# Patient Record
Sex: Female | Born: 2000 | Race: Black or African American | Hispanic: No | Marital: Single | Smoking: Never smoker
Health system: Southern US, Community
[De-identification: ages and names within clinical notes are randomized; demographics above are authoritative.]

## PROBLEM LIST (undated history)

## (undated) DIAGNOSIS — E785 Hyperlipidemia, unspecified: Secondary | ICD-10-CM

## (undated) DIAGNOSIS — F429 Obsessive-compulsive disorder, unspecified: Secondary | ICD-10-CM

## (undated) DIAGNOSIS — F32A Depression, unspecified: Secondary | ICD-10-CM

## (undated) DIAGNOSIS — F5082 Avoidant/restrictive food intake disorder: Secondary | ICD-10-CM

## (undated) HISTORY — DX: Hyperlipidemia, unspecified: E78.5

## (undated) HISTORY — PX: WISDOM TOOTH EXTRACTION: SHX21

## (undated) HISTORY — PX: AXILLARY LYMPH NODE BIOPSY: SHX5737

## (undated) HISTORY — PX: CHOLECYSTECTOMY: SHX55

---
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
## 2018-08-04 ENCOUNTER — Other Ambulatory Visit: Payer: Self-pay | Admitting: General Surgery

## 2018-10-28 ENCOUNTER — Emergency Department (HOSPITAL_COMMUNITY): Payer: Medicaid Other

## 2018-10-28 ENCOUNTER — Other Ambulatory Visit: Payer: Self-pay

## 2018-10-28 ENCOUNTER — Emergency Department (HOSPITAL_COMMUNITY)
Admission: EM | Admit: 2018-10-28 | Discharge: 2018-10-28 | Disposition: A | Discharge status: Home / Self Care | Payer: Medicaid Other | Attending: Emergency Medicine | Admitting: Emergency Medicine

## 2018-10-28 DIAGNOSIS — F419 Anxiety disorder, unspecified: Secondary | ICD-10-CM | POA: Insufficient documentation

## 2018-10-28 DIAGNOSIS — R079 Chest pain, unspecified: Secondary | ICD-10-CM

## 2018-10-28 DIAGNOSIS — Z79899 Other long term (current) drug therapy: Secondary | ICD-10-CM | POA: Diagnosis not present

## 2018-10-28 LAB — I-STAT BETA HCG BLOOD, ED (MC, WL, AP ONLY): I-stat hCG, quantitative: <5 m[IU]/mL (ref <5)

## 2018-10-28 LAB — BASIC METABOLIC PANEL
Anion gap: 9 (ref 5–15)
BUN: 5 mg/dL — ABNORMAL LOW (ref 6–20)
CO2: 23 mmol/L (ref 22–32)
Calcium: 9.2 mg/dL (ref 8.9–10.3)
Chloride: 108 mmol/L (ref 98–111)
Creatinine, Ser: 0.73 mg/dL (ref 0.44–1.00)
GFR calc Af Amer: >60 mL/min (ref >60)
GFR calc non Af Amer: >60 mL/min (ref >60)
Glucose, Bld: 114 mg/dL — ABNORMAL HIGH (ref 70–99)
Potassium: 3.4 mmol/L — ABNORMAL LOW (ref 3.5–5.1)
Sodium: 140 mmol/L (ref 135–145)

## 2018-10-28 LAB — CBC
HCT: 38 % (ref 36.0–46.0)
Hemoglobin: 11.8 g/dL — ABNORMAL LOW (ref 12.0–15.0)
MCH: 25.1 pg — ABNORMAL LOW (ref 26.0–34.0)
MCHC: 31.1 g/dL (ref 30.0–36.0)
MCV: 80.9 fL (ref 80.0–100.0)
Platelets: 570 10*3/uL — ABNORMAL HIGH (ref 150–400)
RBC: 4.7 MIL/uL (ref 3.87–5.11)
RDW: 15.2 % (ref 11.5–15.5)
WBC: 8.6 10*3/uL (ref 4.0–10.5)
nRBC: 0 % (ref 0.0–0.2)

## 2018-10-28 LAB — TROPONIN I (HIGH SENSITIVITY): Troponin I (High Sensitivity): <2 ng/L (ref <18)

## 2018-10-28 IMAGING — DX CHEST - 2 VIEW
2 series · 2 of 2 positions shown · non-contrast
Comparison: None.

CLINICAL DATA: Recurring chest pain for 3 weeks.

EXAM:
CHEST - 2 VIEW

[w chest pa]
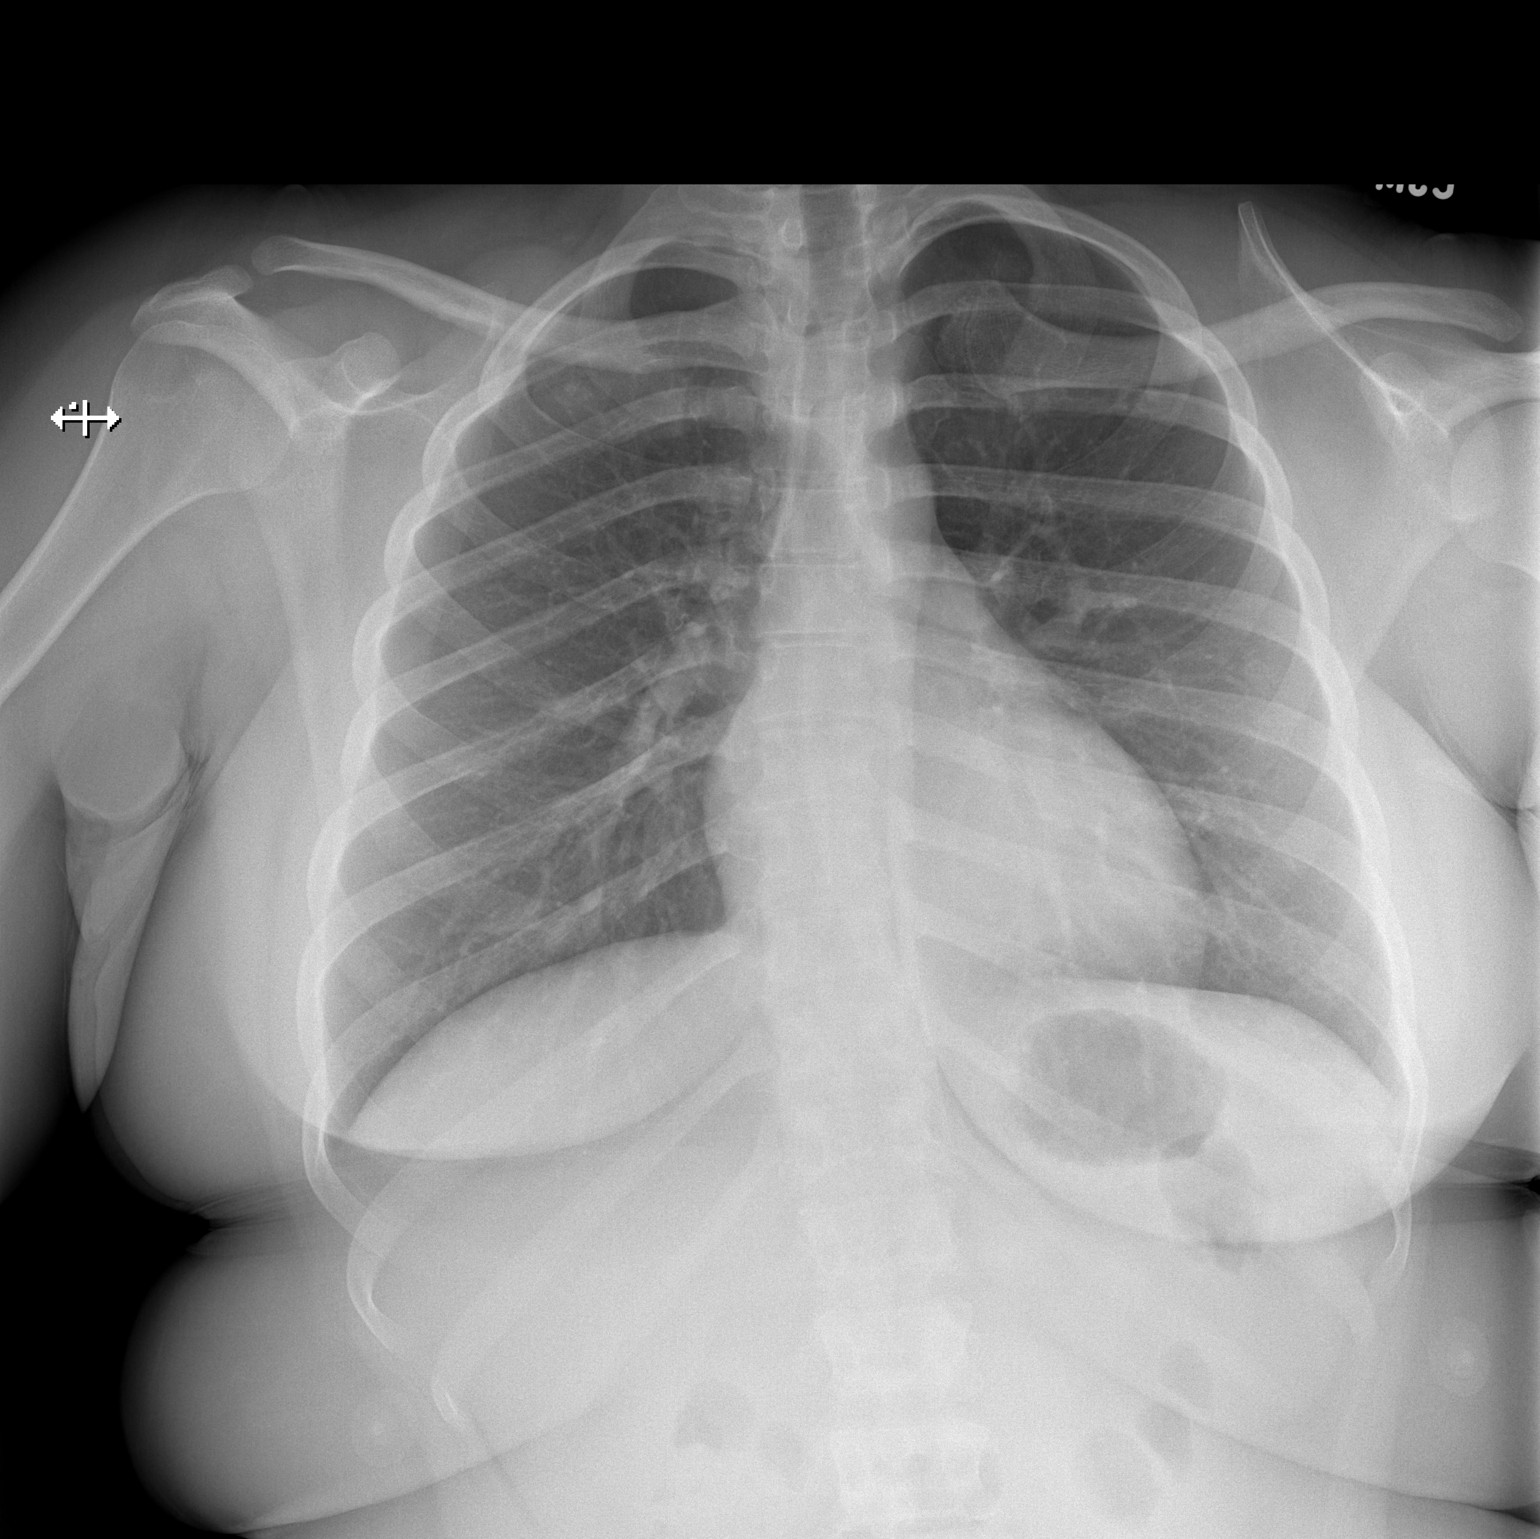

[w chest lat]
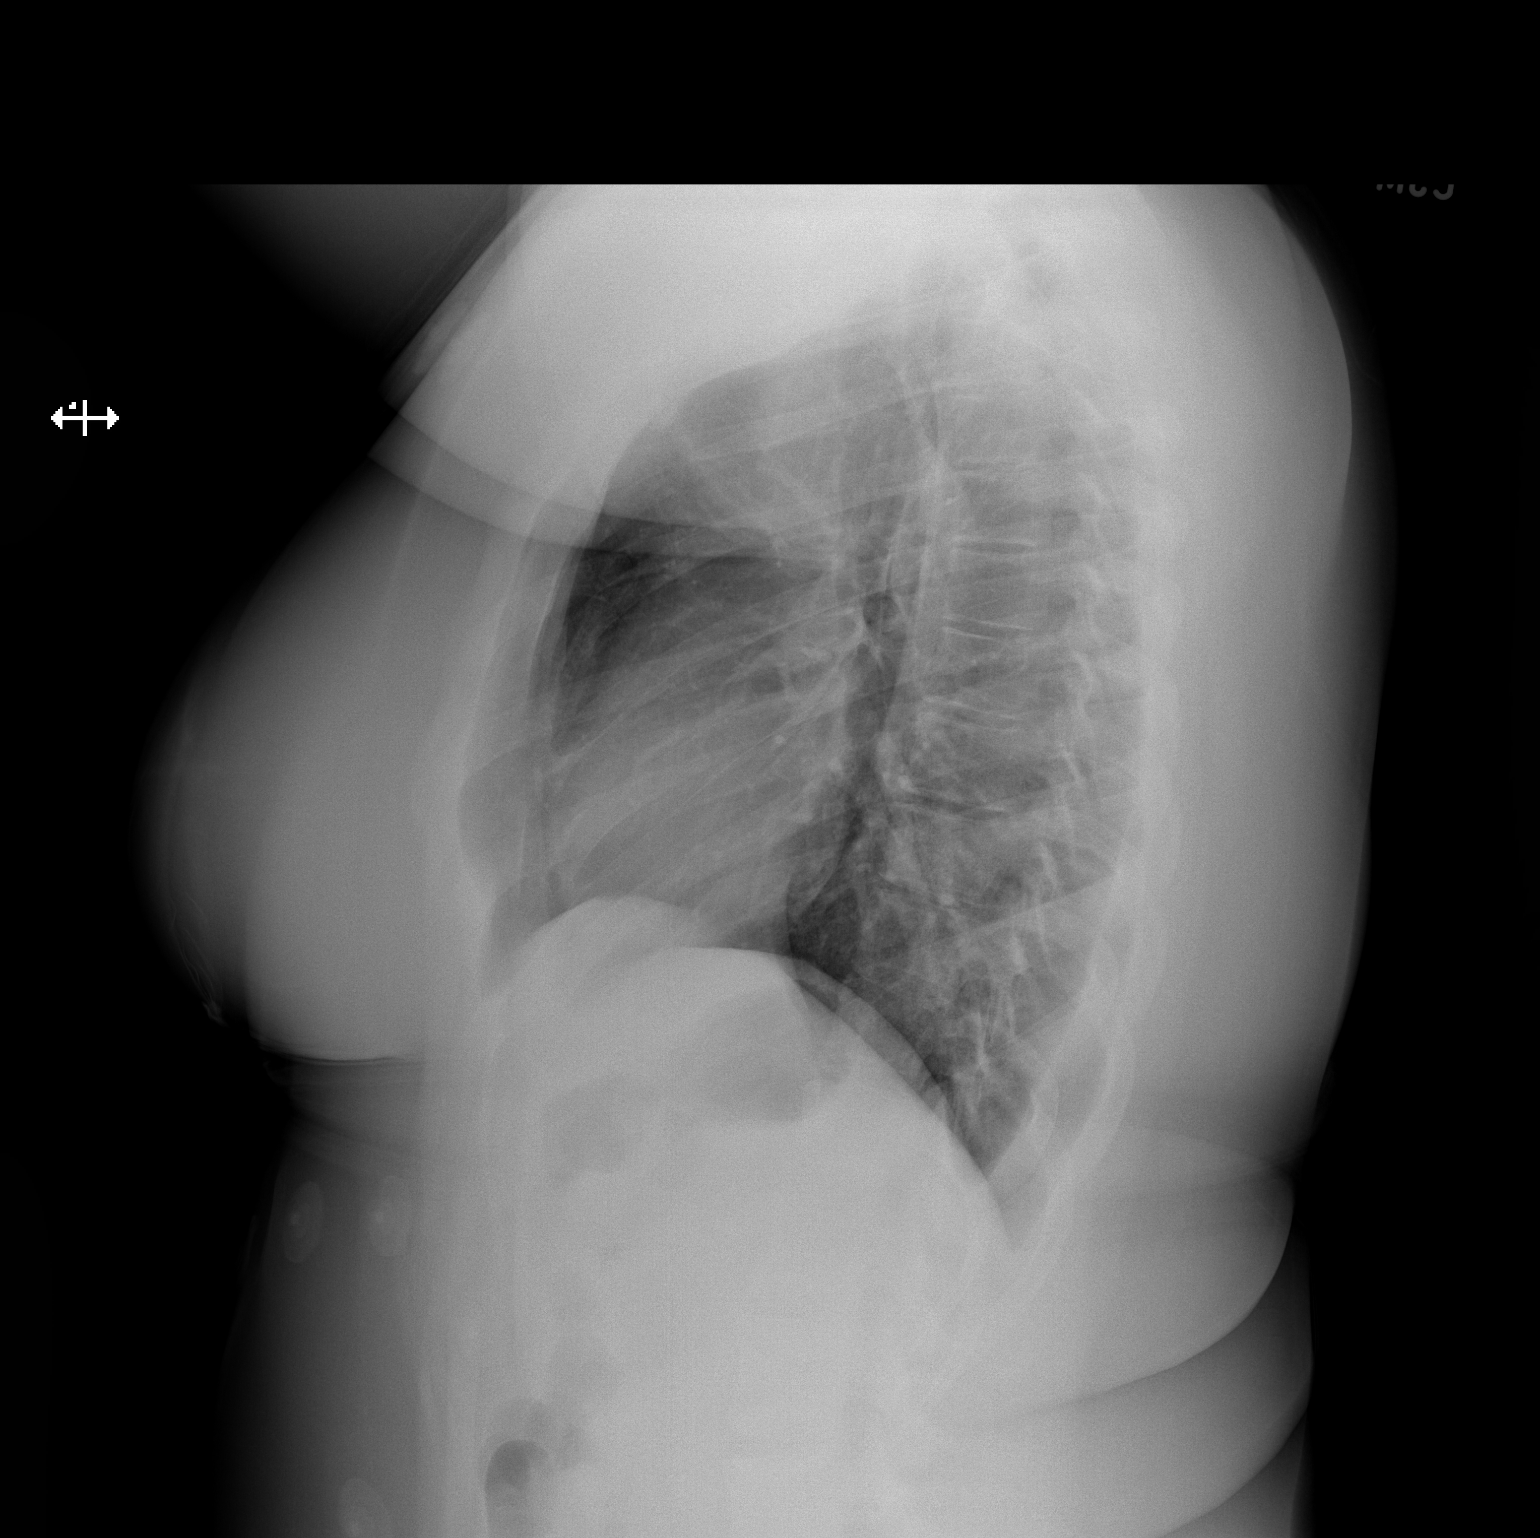

[2 of 2 positions shown; findings below may reference images not displayed]

FINDINGS: The heart size and mediastinal contours are within normal limits.
Both lungs are clear. The visualized skeletal structures are
unremarkable.
IMPRESSION: No active cardiopulmonary disease.

## 2018-10-28 MED ORDER — SODIUM CHLORIDE 0.9% FLUSH: 3.0000 mL | Freq: Once | INTRAVENOUS | Status: DC

## 2018-10-28 NOTE — ED Triage Notes (Signed)
Patient reports intermittent chest pain, mostly beginning in mornings, over the last 2-3 weeks, but has become more frequent throughout the day the last few days. Has chest x-ray done at doctor that was normal last week. History of anxiety and feels her heart racing from time to time, but chest pains have begun to concern her. Resp e/u, skin w/d. Denies shortness of breath, dizziness.

## 2018-10-28 NOTE — Discharge Instructions (Addendum)
Please read attached information. If you experience any new or worsening signs or symptoms please return to the emergency room for evaluation. Please follow-up with your primary care provider or specialist as discussed.  °

## 2018-10-28 NOTE — ED Provider Notes (Signed)
[facility] EMERGENCY DEPARTMENT Provider Note   CSN: [id number] Arrival date & time: 10/28/18  1109    History   Chief Complaint Chief Complaint  Patient presents with  . Anxiety  . Chest Pain    HPI Kristen Pacheco is a 18 y.o. female.     HPI   18 year old female presents today with complaints of anxiety and chest pain.  Patient notes symptoms started proximally 1 month ago with racing heart.  She notes that she wakes up in the morning her heart is racing she feels heaviness on her chest.  She notes that throughout the day the symptoms do improve but come back again the next day.  She notes most recently over the last 3 days she has had increasing discomfort in her chest.  She notes that waking in the morning this morning around 4:30 AM she did have the discomfort and has persisted since that time.  She denies any cough or fever, she notes that her heart is racing and feels like she is having anxiety.  She saw her primary care where she had a chest x-ray that looked normal per her.  She denies any significant past cardiac history, no family history of cardiac disease.  She does not smoke or use drugs.  She denies any significant past medical history.  No history DVT or PE or any significant risk factors.  No lower extremity swelling or edema.  No past medical history on file.  There are no active problems to display for this patient.   Past Surgical History:  Procedure Laterality Date  . AXILLARY LYMPH NODE BIOPSY    . WISDOM TOOTH EXTRACTION       OB History   No obstetric history on file.      Home Medications    Prior to Admission medications   Medication Sig Start Date End Date Taking? Authorizing Provider  ibuprofen (ADVIL,MOTRIN) 100 MG/5ML suspension Take 30 mLs (600 mg total) by mouth every 8 (eight) hours as needed for mild pain or moderate pain. 10/19/15   Clayton Bibles, PA-C    Family History No family history on file.  Social History  Social History   Tobacco Use  . Smoking status: Never Smoker  . Smokeless tobacco: Never Used  Substance Use Topics  . Alcohol use: No  . Drug use: No     Allergies   Patient has no known allergies.   Review of Systems Review of Systems  All other systems reviewed and are negative.    Physical Exam Updated Vital Signs BP (!) 131/97 (BP Location: Right Arm)   Pulse (!) 118   Temp 97.8 F (36.6 C) (Oral)   Resp 18   LMP 10/24/2018   SpO2 98%   Physical Exam Vitals signs and nursing note reviewed.  Constitutional:      Appearance: She is well-developed.  HENT:     Head: Normocephalic and atraumatic.  Eyes:     General: No scleral icterus.       Right eye: No discharge.        Left eye: No discharge.     Conjunctiva/sclera: Conjunctivae normal.     Pupils: Pupils are equal, round, and reactive to light.  Neck:     Musculoskeletal: Normal range of motion.     Vascular: No JVD.     Trachea: No tracheal deviation.  Cardiovascular:     Rate and Rhythm: Normal rate and regular rhythm.  Pulmonary:  Effort: Pulmonary effort is normal. No respiratory distress.     Breath sounds: Normal breath sounds. No stridor. No wheezing, rhonchi or rales.  Musculoskeletal:     Comments: No lower extremity swelling or edema  Neurological:     Mental Status: She is alert and oriented to person, place, and time.     Coordination: Coordination normal.  Psychiatric:        Behavior: Behavior normal.        Thought Content: Thought content normal.        Judgment: Judgment normal.     ED Treatments / Results  Labs (all labs ordered are listed, but only abnormal results are displayed) Labs Reviewed  BASIC METABOLIC PANEL - Abnormal; Notable for the following components:      Result Value   Potassium 3.4 (*)    Glucose, Bld 114 (*)    BUN 5 (*)    All other components within normal limits  CBC - Abnormal; Notable for the following components:   Hemoglobin 11.8 (*)     MCH 25.1 (*)    Platelets 570 (*)    All other components within normal limits  I-STAT BETA HCG BLOOD, ED ([facility], [facility], [facility] ONLY)  TROPONIN I (HIGH SENSITIVITY)  TROPONIN I (HIGH SENSITIVITY)    EKG None  Radiology Dg Chest 2 View  Result Date: 10/28/2018 CLINICAL DATA:  Recurring chest pain for 3 weeks. EXAM: CHEST - 2 VIEW COMPARISON:  None. FINDINGS: The heart size and mediastinal contours are within normal limits. Both lungs are clear. The visualized skeletal structures are unremarkable. IMPRESSION: No active cardiopulmonary disease. Electronically Signed   By: Sherian ReinWei-Chen  Lin M.D.   On: 10/28/2018 12:28    Procedures Procedures (including critical care time)  Medications Ordered in ED Medications - No data to display   Initial Impression / Assessment and Plan / ED Course  I have reviewed the triage vital signs and the nursing notes.  Pertinent labs & imaging results that were available during my care of the patient were reviewed by me and considered in my medical decision making (see chart for details).         Assessment/Plan: 18 year old female presents today with chest pain.  I have high suspicion for anxiety, low suspicion for ACS, PE, SVT, dissection or any other life-threatening etiology.  EKG shows sinus tachycardia, in the room patient's heart rate down to 70.  Patient has reassuring work-up here, she has heart score of 0, Wells of 0, PERC negative.  No signs of infectious etiology.  Patient will follow-up as an outpatient with cardiology, she will return immediately if develops any new or worsening signs or symptoms.  Verbalized understanding and agreement to today's plan had no further questions or concerns at time of discharge.    Final Clinical Impressions(s) / ED Diagnoses   Final diagnoses:  Chest pain, unspecified type  Anxiety    ED Discharge Orders    None       Rosalio LoudHedges, Adib Wahba, PA-C 10/28/18 Modena [id number]    Goldston, Scott, MD 10/29/18 [phone]

## 2018-10-30 ENCOUNTER — Telehealth: Payer: Self-pay

## 2018-10-30 NOTE — Telephone Encounter (Signed)
REFERRAL FROM ED SENT TO SCHEDULING 

## 2019-09-07 ENCOUNTER — Encounter (HOSPITAL_COMMUNITY): Payer: Self-pay

## 2019-09-07 ENCOUNTER — Emergency Department (HOSPITAL_COMMUNITY)
Admission: EM | Admit: 2019-09-07 | Discharge: 2019-09-08 | Disposition: A | Discharge status: Home / Self Care | Payer: Medicaid Other | Attending: Emergency Medicine | Admitting: Emergency Medicine

## 2019-09-07 DIAGNOSIS — F5082 Avoidant/restrictive food intake disorder: Secondary | ICD-10-CM | POA: Diagnosis not present

## 2019-09-07 DIAGNOSIS — Z79899 Other long term (current) drug therapy: Secondary | ICD-10-CM | POA: Diagnosis not present

## 2019-09-07 DIAGNOSIS — R42 Dizziness and giddiness: Secondary | ICD-10-CM | POA: Insufficient documentation

## 2019-09-07 DIAGNOSIS — R110 Nausea: Secondary | ICD-10-CM | POA: Diagnosis present

## 2019-09-07 LAB — CBC
HCT: 42.9 % (ref 36.0–46.0)
Hemoglobin: 13.7 g/dL (ref 12.0–15.0)
MCH: 25.4 pg — ABNORMAL LOW (ref 26.0–34.0)
MCHC: 31.9 g/dL (ref 30.0–36.0)
MCV: 79.4 fL — ABNORMAL LOW (ref 80.0–100.0)
Platelets: 539 10*3/uL — ABNORMAL HIGH (ref 150–400)
RBC: 5.4 MIL/uL — ABNORMAL HIGH (ref 3.87–5.11)
RDW: 15.1 % (ref 11.5–15.5)
WBC: 8.8 10*3/uL (ref 4.0–10.5)
nRBC: 0 % (ref 0.0–0.2)

## 2019-09-07 LAB — COMPREHENSIVE METABOLIC PANEL
ALT: 64 U/L — ABNORMAL HIGH (ref 0–44)
AST: 54 U/L — ABNORMAL HIGH (ref 15–41)
Albumin: 3.9 g/dL (ref 3.5–5.0)
Alkaline Phosphatase: 89 U/L (ref 38–126)
Anion gap: 15 (ref 5–15)
BUN: <5 mg/dL — ABNORMAL LOW (ref 6–20)
CO2: 20 mmol/L — ABNORMAL LOW (ref 22–32)
Calcium: 9.4 mg/dL (ref 8.9–10.3)
Chloride: 102 mmol/L (ref 98–111)
Creatinine, Ser: 0.96 mg/dL (ref 0.44–1.00)
GFR calc Af Amer: >60 mL/min (ref >60)
GFR calc non Af Amer: >60 mL/min (ref >60)
Glucose, Bld: 108 mg/dL — ABNORMAL HIGH (ref 70–99)
Potassium: 3.2 mmol/L — ABNORMAL LOW (ref 3.5–5.1)
Sodium: 137 mmol/L (ref 135–145)
Total Bilirubin: 1.3 mg/dL — ABNORMAL HIGH (ref 0.3–1.2)
Total Protein: 8 g/dL (ref 6.5–8.1)

## 2019-09-07 LAB — LIPASE, BLOOD: Lipase: 35 U/L (ref 11–51)

## 2019-09-07 LAB — I-STAT BETA HCG BLOOD, ED (MC, WL, AP ONLY): I-stat hCG, quantitative: <5 m[IU]/mL (ref <5)

## 2019-09-07 LAB — CBG MONITORING, ED: Glucose-Capillary: 99 mg/dL (ref 70–99)

## 2019-09-07 MED ORDER — SODIUM CHLORIDE 0.9% FLUSH: 3.0000 mL | Freq: Once | INTRAVENOUS | Status: DC

## 2019-09-07 NOTE — ED Triage Notes (Signed)
Pt reports that she has not ate in two weeks since having her gallbladder removed and went to her PCP and was sent here to be admitted to an eating disorder clinic. Pt reports she is not eating because she is scared she will vomit, prescribed antiemetics and has not taken them. Pt denies SI/HI/AVH

## 2019-09-08 LAB — MAGNESIUM: Magnesium: 1.8 mg/dL (ref 1.7–2.4)

## 2019-09-08 LAB — URINALYSIS, ROUTINE W REFLEX MICROSCOPIC
Bilirubin Urine: NEGATIVE
Glucose, UA: NEGATIVE mg/dL
Ketones, ur: 80 mg/dL — AB
Nitrite: NEGATIVE
Protein, ur: 100 mg/dL — AB
RBC / HPF: >50 RBC/hpf — ABNORMAL HIGH (ref 0–5)
Specific Gravity, Urine: 1.018 (ref 1.005–1.030)
pH: 5 (ref 5.0–8.0)

## 2019-09-08 LAB — TSH: TSH: 1.537 u[IU]/mL (ref 0.350–4.500)

## 2019-09-08 LAB — PHOSPHORUS: Phosphorus: 3.8 mg/dL (ref 2.5–4.6)

## 2019-09-08 LAB — CK: Total CK: 66 U/L (ref 38–234)

## 2019-09-08 MED ORDER — SODIUM CHLORIDE 0.9 % IV BOLUS
1000.0000 mL | Freq: Once | INTRAVENOUS | Status: AC
  Administered 2019-09-08: 1000 mL via INTRAVENOUS

## 2019-09-08 MED ORDER — POTASSIUM CHLORIDE CRYS ER 20 MEQ PO TBCR
30.0000 meq | EXTENDED_RELEASE_TABLET | Freq: Once | ORAL | Status: AC
  Administered 2019-09-08: 30 meq via ORAL
  Filled 2019-09-08: qty 1

## 2019-09-08 MED ORDER — POTASSIUM CHLORIDE ER 10 MEQ PO TBCR: 20.0000 meq | EXTENDED_RELEASE_TABLET | Freq: Every day | ORAL | 0 refills | Status: DC

## 2019-09-08 NOTE — ED Notes (Signed)
Pt made aware we need a urine sample. Pt voice she can not go at this time

## 2019-09-08 NOTE — Discharge Instructions (Addendum)
You have been medically cleared, your labs are reassuring.  I want you to take the over-the-counter potassium as we spoke about.  I want you to follow-up with your primary care and discuss your options for outpatient services for eating disorders.  I want you to use the guides attached.  Come back to the emergency department if you have any new or worsening concerning symptoms.  Have your potassium rechecked by your primary care in the next couple of days.

## 2019-09-08 NOTE — BH Assessment (Addendum)
Comprehensive Clinical Assessment (CCA) Screening, Triage and Referral Note  09/08/2019 Kristen Pacheco [id number]  Visit Diagnosis: No diagnosis found.   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  Patient reports she had her gal bladder removed in May of 2020.  She was advised to limit fatty foods.  She has had difficulty with disordered eating and restricting on an off since.  She is working with a Health and safety inspector and provider at [employer] clinic.  Patient is currently enrolled and has been taking remote classes.  Her clinic provider advised that she present to [facility] for further evaluation and medical care, as she has been limiting and restricting intake.  She reports she last ate a piece of toast several days ago and has since only been drinking minimal amounts of water and gatorade occasionally.  Her provider and nutritionist have recommended inpatient treatment at an eating disorder program.  She was referred to [facility], however she learned today that they do not take her insurance.  She was referred to [facility] for Eating Disorders today.  She was informed that they may not have a bed for 2-3 weeks and her providers wanted her to be seen for a medical exam sooner.  Patient is open to inpatient eating disorder treatment if this is strongly advised and necessary, however prefers a lower level of care.  She worries about taking a medical leave next semester and getting behind with graduating with her class.  Patient denies SI, HI and AVH.  She has no psychiatric history outside of situational depression related to transitioning to remote learning last year.  She was prescribed wellbutrin and continues to take it currently.    Disposition:  Per Kristen Sequin, NP patient is psychiatrically cleared.  It is  recommended that once medically cleared, that patient follow up with current providers who are seeking treatment for patient.  She was also provided with psychologytoday website to search for area outpatient providers who specialize in eating disorders.    Patient Reported Information How did you hear about [place]? Self   Referral name: No data recorded  Referral phone number: No data recorded Whom do you see for routine medical problems? Primary Care   Practice/Facility Name: [facility]  - Kristen Pacheco   Practice/Facility Phone Number: No data recorded  Name of Contact: See above   Contact Number: No data recorded  Contact Fax Number: No data recorded  Prescriber Name: See above   Prescriber Address (if known): [facility]  What Is the Reason for Your Visit/Call Today? Patient states since gall bladder removed, became overly focused on food restriction of fatty foods.  This has become problematic.  How Long Has This Been Causing You Problems? 1-6 months  Have You Recently Been in Any Inpatient Treatment (Hospital/Detox/Crisis Center/28-Day Program)? No   Name/Location of Program/Hospital:No data recorded  How Long Were You There? No data recorded  When Were You Discharged? No data recorded Have You Ever Received Services From [facility] Before? Yes   Who Do You See at [facility]? Currently at [facility]  Have You Recently Had Any Thoughts About Hurting Yourself? No   Are You Planning to Commit Suicide/Harm Yourself At This time?  No  Have you Recently Had Thoughts About Hurting Someone Kristen Pacheco? No   Explanation: No data  recorded Have You Used Any Alcohol or Drugs in the Past 24 Hours? No   How Long Ago Did You Use Drugs or Alcohol?  No data recorded  What Did You Use and How Much? No data recorded What Do You Feel Would Help You the Most Today? Assessment Only  Do You Currently Have a Therapist/Psychiatrist? Yes   Name of Therapist/Psychiatrist: Marlowe Pacheco with  [place] Recently Discharged From Any Office Practice or Programs? No   Explanation of Discharge From Practice/Program:  No data recorded    CCA Screening Triage Referral Assessment Type of Contact: Tele-Assessment   Is this Initial or Reassessment? Initial Assessment   Date Telepsych consult ordered in [facility]:  09/08/19   Time Telepsych consult ordered in [facility]:  1145  Patient Reported Information Reviewed? Yes   Patient Left Without Being Seen? No data recorded  Reason for Not Completing Assessment: No data recorded Collateral Involvement: Patient's mother is present for assessment, at patient's request.  Does Patient Have a Court Appointed Legal Guardian? No data recorded  Name and Contact of Legal Guardian:  No data recorded If Minor and Not Living with Parent(s), Who has Custody? No data recorded Is CPS involved or ever been involved? Never  Is APS involved or ever been involved? Never  Patient Determined To Be At Risk for Harm To Self or Others Based on Review of Patient Reported Information or Presenting Complaint? No   Method: No data recorded  Availability of Means: No data recorded  Intent: No data recorded  Notification Required: No data recorded  Additional Information for Danger to Others Potential:  No data recorded  Additional Comments for Danger to Others Potential:  No data recorded  Are There Guns or Other Weapons in Your Home?  No data recorded   Types of Guns/Weapons: No data recorded   Are These Weapons Safely Secured?                              No data recorded   Who Could Verify You Are Able To Have These Secured:    No data recorded Do You Have any Outstanding Charges, Pending Court Dates, Parole/Probation? No data recorded Contacted To Inform of Risk of Harm To Self or Others: No data recorded Location of Assessment: [facility] ED  Does Patient Present under Involuntary Commitment? No   IVC Papers Initial File Date: No data  recorded  [place] of Residence: No data recorded Patient Currently Receiving the Following Services: Individual Therapy   Determination of Need: Routine (7 days)   Options For Referral: Outpatient Therapy   Fransico Meadow

## 2019-09-08 NOTE — ED Provider Notes (Signed)
[facility] EMERGENCY DEPARTMENT Provider Note   CSN: [id number] Arrival date & time: 09/07/19  1948     History Chief Complaint  Patient presents with  . Nausea    Kristen Pacheco is a 19 y.o. 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  Patient states that she goes to [facility], has provider over there states that she needs admission into eating disorder clinic, however admission into [facility] was going to take 2 to 3 weeks and provider wanted to first make sure she was medically cleared.  When speaking to patient she states that she has been having trouble eating for the past year, however the past 2 weeks has been extremely difficult for her.  Patient states that she has had 7 slices of toast over the past 3 weeks with minimal water intake.  Patient states that she is very afraid to eat because she is scared that she is going to throw up.  Denies any recent triggers.  Denies any pain anywhere.  States that she feels extremely dizzy when she stands for long time.  Denies any dizziness currently.  Denies any vision changes, headache, chest pain, palpitations, abdominal pain, nausea, vomiting, weakness.  Per previous physicians no at [facility] states that she has had a 4 pound weight loss over the past 5 days.  States that she feels repulsed by toast.  States that she cannot take her Wellbutrin without food.  Has been taking omeprazole.  At that visit she did have orthostatic changes with a previously low glucose at 60.  They recommended repeat eval in the ER .  HPI     History reviewed. No pertinent past medical history.  There are no problems to display for this patient.   Past Surgical History:  Procedure Laterality Date  . AXILLARY LYMPH NODE BIOPSY    . WISDOM TOOTH EXTRACTION       OB  History   No obstetric history on file.     No family history on file.  Social History   Tobacco Use  . Smoking status: Never Smoker  . Smokeless tobacco: Never Used  Substance Use Topics  . Alcohol use: No  . Drug use: No    Home Medications Prior to Admission medications   Medication Sig Start Date End Date Taking? Authorizing Provider  buPROPion (WELLBUTRIN XL) 300 MG 24 hr tablet Take 300 mg by mouth every morning. 06/07/19   [provider]  ibuprofen (ADVIL,MOTRIN) 100 MG/5ML suspension Take 30 mLs (600 mg total) by mouth every 8 (eight) hours as needed for mild pain or moderate pain. 10/19/15   Clayton Bibles, PA-C  potassium chloride (KLOR-CON) 10 MEQ tablet Take 2 tablets (20 mEq total) by mouth daily for 5 days. 09/08/19 09/13/19  Alfredia Client, PA-C    Allergies    Patient has no known allergies.  Review of Systems   Review of Systems  Constitutional: Positive for unexpected weight change. Negative for chills, diaphoresis, fatigue and fever.  HENT: Negative for congestion, sore throat and trouble swallowing.   Eyes: Negative for pain and visual disturbance.  Respiratory: Negative for cough, shortness of breath and wheezing.   Cardiovascular: Negative for chest pain, palpitations and leg swelling.  Gastrointestinal: Negative for abdominal distention, abdominal pain, diarrhea, nausea and vomiting.  Genitourinary: Negative for difficulty urinating.  Musculoskeletal: Negative  for back pain, neck pain and neck stiffness.  Skin: Negative for pallor.  Neurological: Negative for dizziness, speech difficulty, weakness and headaches.  Psychiatric/Behavioral: Negative for confusion.    Physical Exam Updated Vital Signs BP 103/61   Pulse 62   Resp 20   SpO2 100%   Physical Exam Constitutional:      General: She is not in acute distress.    Appearance: Normal appearance. She is normal weight. She is not ill-appearing, toxic-appearing or diaphoretic.     Comments:  Patient appears comfortable in bed, normal weight.  HENT:     Mouth/Throat:     Mouth: Mucous membranes are moist.     Pharynx: Oropharynx is clear.  Eyes:     General: No scleral icterus.    Extraocular Movements: Extraocular movements intact.     Pupils: Pupils are equal, round, and reactive to light.  Cardiovascular:     Rate and Rhythm: Normal rate and regular rhythm.     Pulses: Normal pulses.     Heart sounds: Normal heart sounds.  Pulmonary:     Effort: Pulmonary effort is normal. No respiratory distress.     Breath sounds: Normal breath sounds. No stridor. No wheezing, rhonchi or rales.  Chest:     Chest wall: No tenderness.  Abdominal:     General: Abdomen is flat. There is no distension.     Palpations: Abdomen is soft.     Tenderness: There is no abdominal tenderness. There is no guarding or rebound.  Musculoskeletal:        General: No swelling or tenderness. Normal range of motion.     Cervical back: Normal range of motion and neck supple. No rigidity.     Right lower leg: No edema.     Left lower leg: No edema.  Skin:    General: Skin is warm and dry.     Capillary Refill: Capillary refill takes less than 2 seconds.     Coloration: Skin is not pale.  Neurological:     General: No focal deficit present.     Mental Status: She is alert and oriented to person, place, and time.     Cranial Nerves: No cranial nerve deficit.     Sensory: No sensory deficit.     Motor: No weakness.     Coordination: Coordination normal.     Gait: Gait normal.  Psychiatric:        Mood and Affect: Mood normal.        Behavior: Behavior normal.     ED Results / Procedures / Treatments   Labs (all labs ordered are listed, but only abnormal results are displayed) Labs Reviewed  COMPREHENSIVE METABOLIC PANEL - Abnormal; Notable for the following components:      Result Value   Potassium 3.2 (*)    CO2 20 (*)    Glucose, Bld 108 (*)    BUN <5 (*)    AST 54 (*)    ALT 64 (*)      Total Bilirubin 1.3 (*)    All other components within normal limits  CBC - Abnormal; Notable for the following components:   RBC 5.40 (*)    MCV 79.4 (*)    MCH 25.4 (*)    Platelets 539 (*)    All other components within normal limits  URINALYSIS, ROUTINE W REFLEX MICROSCOPIC - Abnormal; Notable for the following components:   Color, Urine AMBER (*)    APPearance HAZY (*)  Hgb urine dipstick LARGE (*)    Ketones, ur 80 (*)    Protein, ur 100 (*)    Leukocytes,Ua TRACE (*)    RBC / HPF >50 (*)    Bacteria, UA FEW (*)    All other components within normal limits  URINE CULTURE  LIPASE, BLOOD  MAGNESIUM  PHOSPHORUS  CK  TSH  CBG MONITORING, ED  I-STAT BETA HCG BLOOD, ED ([facility], [facility], [facility] ONLY)    EKG EKG Interpretation  Date/Time:  Wednesday September 08 2019 14:24:25 EDT Ventricular Rate:  92 PR Interval:    QRS Duration: 92 QT Interval:  340 QTC Calculation: 421 R Axis:   64 Text Interpretation: Sinus rhythm Borderline Q waves in lateral leads Nonspecific T abnormalities, anterior leads pronounced early repolarization changes compared to previous Confirmed by Arby Barrette [phone]) on 09/08/2019 2:33:18 PM   Radiology No results found.  Procedures Procedures (including critical care time)  Medications Ordered in ED Medications  sodium chloride flush (NS) 0.9 % injection 3 mL (has no administration in time range)  sodium chloride 0.9 % bolus 1,000 mL (0 mLs Intravenous Stopped 09/08/19 1628)  potassium chloride (KLOR-CON) CR tablet 30 mEq (30 mEq Oral Given 09/08/19 1607)    ED Course  I have reviewed the triage vital signs and the nursing notes.  Pertinent labs & imaging results that were available during my care of the patient were reviewed by me and considered in my medical decision making (see chart for details).    MDM Rules/Calculators/A&P                         Kristen Pacheco is a 19 y.o. 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  PCP wanted medical evaluation to make sure she was medically cleared, patient is awaiting placement at eating disorder clinic in [place].  PCP was concerned that she might have to stay overnight if labs were not reassuring.Patient is denying SI, HI, hallucinations.  CBC and CMP stable.  Potassium 3.2, will replete this orally.  Patient has not had any nausea or vomiting here in the ER. Urinalysis with signs of dehydration.  Patient is on period which explains blood in urine.  Small leukocytes with few bacteria, will obtain urine culture at this time.  Is not complaining of any dysuria.    Will consult TTS at this time.  TTS recommendation per Arletta Bale, NP states that patient is psychiatry cleared.  They state that they want her to follow-up with current providers who are seeking treatment for patient, they also provided her with a website today to search for areas for outpatient providers that specialize in eating disorders.  Labs and EKG reassuring.  Normal BMI. Patient has been medically cleared.   awaiting TSH.  Pt wants to go home, I expressed that she could find TSH on my chart.  Patient states that she has been here for almost 24 hours, wants to sleep in her own bed.  States that she will follow up outpatient.  Patient and mom are happy that work-up was negative.  Patient is agreeable with discharge.  Patient is denying oral potassium here in the ER, states that she will take them at home.  Doubt need for further emergent work up at this time. I explained the diagnosis and have given  explicit precautions to return to the ER including for any other new or worsening symptoms. The patient understands and accepts the medical plan as it's been dictated and I have answered their questions. Discharge instructions concerning home care and prescriptions  have been given. The patient is STABLE and is discharged to home in good condition.  .I discussed this case with my attending physician who cosigned this note including patient's presenting symptoms, physical exam, and planned diagnostics and interventions. Attending physician stated agreement with plan or made changes to plan which were implemented.   Attending physician assessed patient at bedside.    Final Clinical Impression(s) / ED Diagnoses Final diagnoses:  Avoidant-restrictive food intake disorder (ARFID)    Rx / DC Orders ED Discharge Orders         Ordered    potassium chloride (KLOR-CON) 10 MEQ tablet  Daily     Discontinue  Reprint     09/08/19 1811           Farrel Gordon, PA-C 09/08/19 1859    Arby Barrette, MD 09/12/19 [phone]

## 2019-09-09 LAB — URINE CULTURE

## 2019-10-13 DIAGNOSIS — F5082 Avoidant/restrictive food intake disorder: Secondary | ICD-10-CM | POA: Diagnosis present

## 2019-10-13 DIAGNOSIS — F330 Major depressive disorder, recurrent, mild: Secondary | ICD-10-CM | POA: Insufficient documentation

## 2021-08-01 ENCOUNTER — Encounter (HOSPITAL_BASED_OUTPATIENT_CLINIC_OR_DEPARTMENT_OTHER): Payer: Self-pay

## 2021-08-01 ENCOUNTER — Emergency Department (HOSPITAL_BASED_OUTPATIENT_CLINIC_OR_DEPARTMENT_OTHER)
Admission: EM | Admit: 2021-08-01 | Discharge: 2021-08-02 | Disposition: A | Discharge status: Home / Self Care | Payer: Medicaid Other | Attending: Emergency Medicine | Admitting: Emergency Medicine

## 2021-08-01 DIAGNOSIS — N898 Other specified noninflammatory disorders of vagina: Secondary | ICD-10-CM

## 2021-08-01 DIAGNOSIS — N7689 Other specified inflammation of vagina and vulva: Secondary | ICD-10-CM | POA: Diagnosis present

## 2021-08-01 MED ORDER — DOXYCYCLINE CALCIUM 50 MG/5ML PO SYRP: 100.0000 mg | ORAL_SOLUTION | Freq: Two times a day (BID) | ORAL | 0 refills | Status: AC

## 2021-08-01 MED ORDER — DOXYCYCLINE HYCLATE 100 MG PO CAPS: 100.0000 mg | ORAL_CAPSULE | Freq: Two times a day (BID) | ORAL | 0 refills | Status: DC

## 2021-08-01 NOTE — ED Provider Notes (Signed)
?[facility] EMERGENCY DEPT ?Provider Note ? ? ?CSN: [id number] ?Arrival date & time: 08/01/21  1758 ? ?  ? ?History ? ?Chief Complaint  ?Patient presents with  ? sore area at right labia majora  ? ? ?Kristen Pacheco is a 21 y.o. female. ? ?21 y.o female with a of right soreness to her labia for 2 weeks worsen two days ago. Patient reports noticing a prior history of ingrown hairs, thought this was likely the same.  Does report the sore becoming more painful, and now actively draining.  She has not applied any medication to it to help with symptomatic treatment.  She did not try draining this on her own.  She is currently sexually active, with 2 partners in the past year.  She does not have any concern for sexually transmitted infection on today's visit. She denies any vaginal discharge, vaginal bleeding or fevers.  ? ?The history is provided by the patient and a relative.  ? ?  ? ?Home Medications ?Prior to Admission medications   ?Medication Sig Start Date End Date Taking? Authorizing Provider  ?doxycycline (VIBRAMYCIN) 100 MG capsule Take 1 capsule (100 mg total) by mouth 2 (two) times daily for 5 days. 08/01/21 08/06/21 Yes Mashal Slavick, Leonie Douglas, PA-C  ?buPROPion (WELLBUTRIN XL) 300 MG 24 hr tablet Take 300 mg by mouth every morning. 06/07/19   [provider]  ?ibuprofen (ADVIL,MOTRIN) 100 MG/5ML suspension Take 30 mLs (600 mg total) by mouth every 8 (eight) hours as needed for mild pain or moderate pain. 10/19/15   Trixie Dredge, PA-C  ?potassium chloride (KLOR-CON) 10 MEQ tablet Take 2 tablets (20 mEq total) by mouth daily for 5 days. 09/08/19 09/13/19  Farrel Gordon, PA-C  ?   ? ?Allergies    ?Patient has no known allergies.   ? ?Review of Systems   ?Review of Systems  ?Constitutional:  Negative for chills and fever.  ?Respiratory:  Negative for shortness of breath.   ?Cardiovascular:  Negative for chest pain.  ?Gastrointestinal:  Negative for abdominal pain.  ?Genitourinary:  Negative for flank pain.   ?Skin:  Positive for wound.  ?Neurological:  Negative for light-headedness.  ?All other systems reviewed and are negative. ? ?Physical Exam ?Updated Vital Signs ?BP 102/82 (BP Location: Right Arm)   Pulse 99   Temp 98 ?F (36.7 ?C)   Resp 16   SpO2 100%  ?Physical Exam ?Vitals and nursing note reviewed. Exam conducted with a chaperone present.  ?Constitutional:   ?   Appearance: Normal appearance.  ?HENT:  ?   Head: Normocephalic and atraumatic.  ?   Mouth/Throat:  ?   Mouth: Mucous membranes are moist.  ?Eyes:  ?   Pupils: Pupils are equal, round, and reactive to light.  ?Cardiovascular:  ?   Rate and Rhythm: Normal rate.  ?Pulmonary:  ?   Effort: Pulmonary effort is normal.  ?Abdominal:  ?   General: Abdomen is flat.  ?Genitourinary: ? ? ?   Comments: Chaperone by Marijean Niemann NT.  ?Musculoskeletal:  ?   Cervical back: Normal range of motion and neck supple.  ?Skin: ?   General: Skin is warm and dry.  ?Neurological:  ?   Mental Status: She is alert and oriented to person, place, and time.  ? ? ?ED Results / Procedures / Treatments   ?Labs ?(all labs ordered are listed, but only abnormal results are displayed) ?Labs Reviewed  ?RPR  ?HIV ANTIBODY (ROUTINE TESTING W REFLEX)  ? ? ?EKG ?None ? ?  Radiology ?No results found. ?Procedures ?Procedures  ? ? ?Medications Ordered in ED ?Medications - No data to display ? ?ED Course/ Medical Decision Making/ A&P ?  ?                        ?Medical Decision Making ?Amount and/or Complexity of Data Reviewed ?Labs: ordered. ? ? ?Patient presents to the ED with lesion to the right vaginal area for the past several weeks, worsened a couple days ago and began also actively draining.  She is sexually active with 2 partners over the past year. ? ?During evaluation of a Magazine features editor at the bedside, there is a small singular lesion with no surrounding erythema, actively draining, more so looks like a scab has been picked over then a folliculitis.  No surrounding induration,  no fluctuance, I tried to push on this lesion in order to obtain any drainage, no fluctuance noted, no drainage of blood. ? ?I discussed with patient folliculitis versus isolated lesion concern for syphilis.  She is agreeable to testing today with RPR, will also test for HIV.  She is without any vaginal discharge, abdominal pain or concern for other sexually transmitted infection.  Deferred testing at this time.  I discussed this with patient along with sister at the bedside who are agreeable with plan and management. ? ?We will also send home with short prescription for doxycycline to help treat for folliculitis in this case.  I did discuss with patient results will be available in 48 hours.  No systemic signs such as fever, lymphadenopathy to suggest any other infection.  Patient stable for discharge. ?  ? ?Portions of this note were generated with Scientist, clinical (histocompatibility and immunogenetics). Dictation errors may occur despite best attempts at proofreading.   ?Final Clinical Impression(s) / ED Diagnoses ?Final diagnoses:  ?Vaginal lesion  ? ? ?Rx / DC Orders ?ED Discharge Orders   ? ?      Ordered  ?  doxycycline (VIBRAMYCIN) 100 MG capsule  2 times daily       ? 08/01/21 2119  ? ?  ?  ? ?  ? ? ?  ?Claude Manges, PA-C ?08/01/21 2145 ? ?  ?Cheryll Cockayne, MD ?08/01/21 2348 ? ?

## 2021-08-01 NOTE — Discharge Instructions (Addendum)
I have prescribed antibiotics to help prevent any further infection to your vaginal area. Please take 1 tablet twice a day for the next 5 days.  ? ?You were also tested today for syphilis, HIV, these results should return to you in the next 48 hours. ?

## 2021-08-01 NOTE — ED Triage Notes (Signed)
She c/o sore area on right labia majora x 2 weeks. It has become more sore and is now "draining". She is in  no distress. ?

## 2021-08-02 LAB — HIV ANTIBODY (ROUTINE TESTING W REFLEX): HIV Screen 4th Generation wRfx: NONREACTIVE

## 2021-08-03 LAB — RPR: RPR Ser Ql: NONREACTIVE

## 2021-08-26 ENCOUNTER — Emergency Department (HOSPITAL_BASED_OUTPATIENT_CLINIC_OR_DEPARTMENT_OTHER)
Admission: EM | Admit: 2021-08-26 | Discharge: 2021-08-27 | Disposition: A | Discharge status: Home / Self Care | Payer: Medicaid Other | Attending: Emergency Medicine | Admitting: Emergency Medicine

## 2021-08-26 ENCOUNTER — Other Ambulatory Visit: Payer: Self-pay

## 2021-08-26 ENCOUNTER — Encounter (HOSPITAL_BASED_OUTPATIENT_CLINIC_OR_DEPARTMENT_OTHER): Payer: Self-pay

## 2021-08-26 DIAGNOSIS — F419 Anxiety disorder, unspecified: Secondary | ICD-10-CM | POA: Insufficient documentation

## 2021-08-26 DIAGNOSIS — F32A Depression, unspecified: Secondary | ICD-10-CM | POA: Diagnosis not present

## 2021-08-26 DIAGNOSIS — R209 Unspecified disturbances of skin sensation: Secondary | ICD-10-CM | POA: Insufficient documentation

## 2021-08-26 DIAGNOSIS — F429 Obsessive-compulsive disorder, unspecified: Secondary | ICD-10-CM | POA: Insufficient documentation

## 2021-08-26 HISTORY — DX: Obsessive-compulsive disorder, unspecified: F42.9

## 2021-08-26 HISTORY — DX: Depression, unspecified: F32.A

## 2021-08-26 HISTORY — DX: Avoidant/restrictive food intake disorder: F50.82

## 2021-08-26 NOTE — ED Notes (Signed)
Vague symptoms reported, states she began having bilateral numbness on Saturday.   Some nausea denies any pain

## 2021-08-26 NOTE — ED Triage Notes (Signed)
Bilateral hand numbness & nausea began Saturday  Denies any pain

## 2021-08-27 ENCOUNTER — Emergency Department (HOSPITAL_BASED_OUTPATIENT_CLINIC_OR_DEPARTMENT_OTHER): Payer: Medicaid Other

## 2021-08-27 IMAGING — CT CT HEAD W/O CM
4 series · 16 of 47 positions shown, 18 images · non-contrast
Comparison: None Available.

CLINICAL DATA: Bilateral hand numbness and nausea



[Series 2: head wo · axial · 0.38mm/px · z∈[-431,-311]mm · 7 of 32 slices shown, 9 images]
[im 4/32  brain]
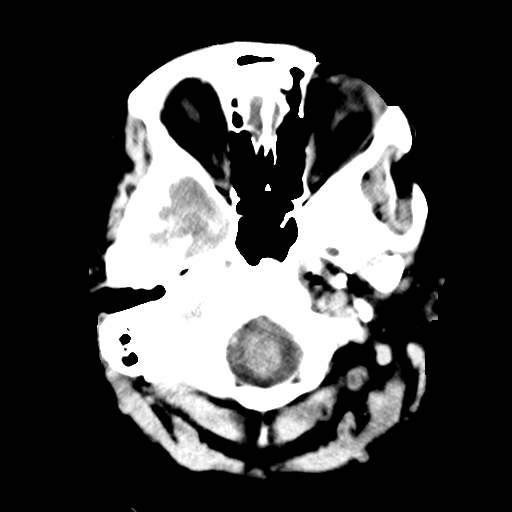
[im 4/32  bone]
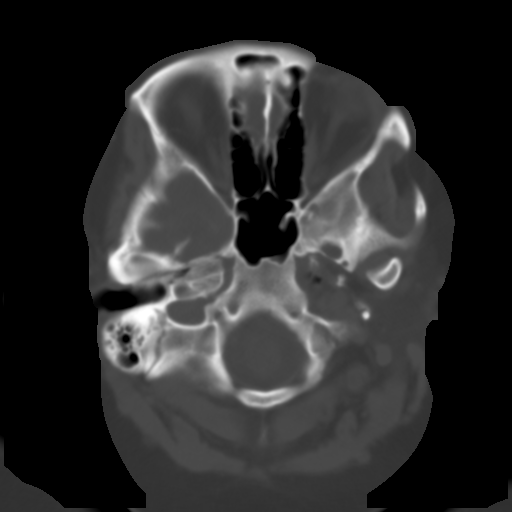
[im 8/32  brain]
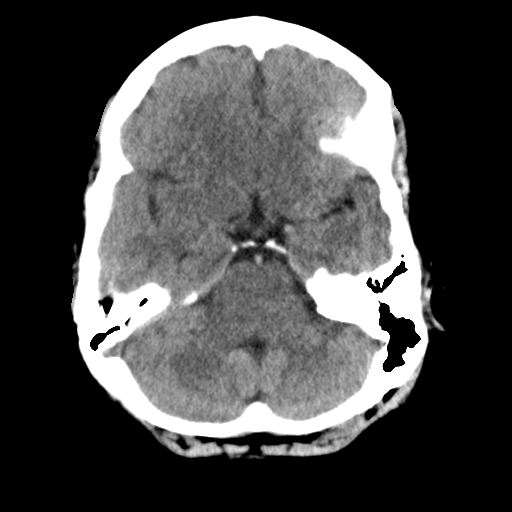
[im 12/32  brain]
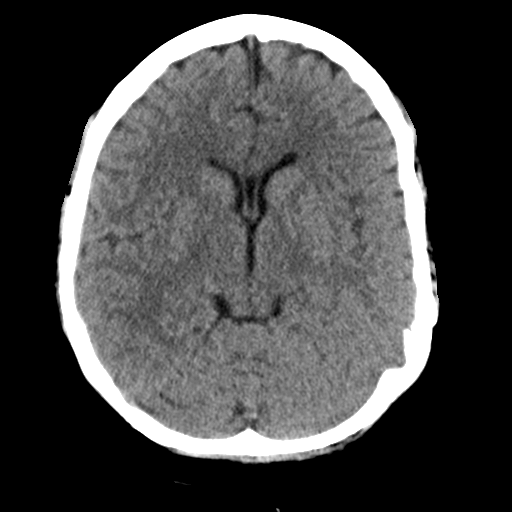
[im 16/32  brain]
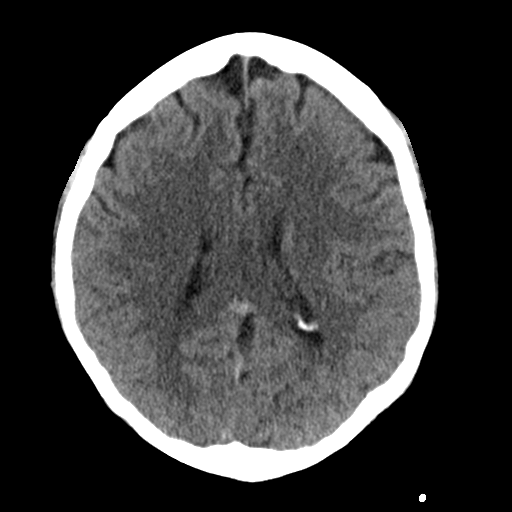
[im 20/32  brain]
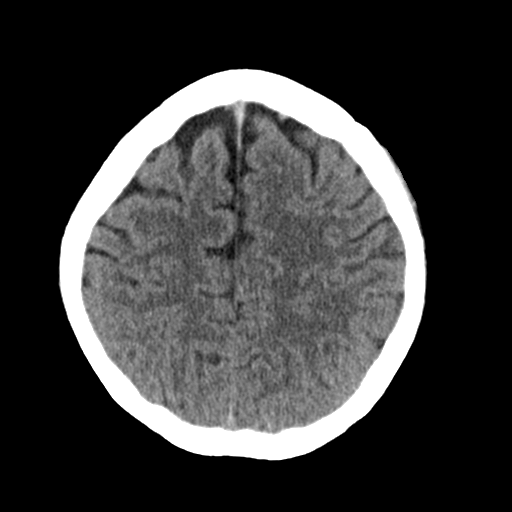
[im 20/32  bone]
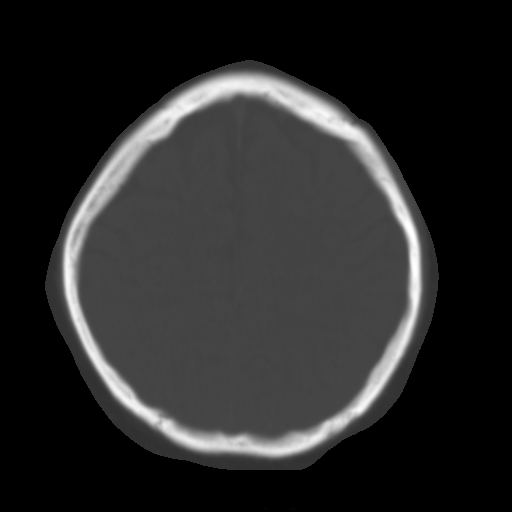
[im 24/32  brain]
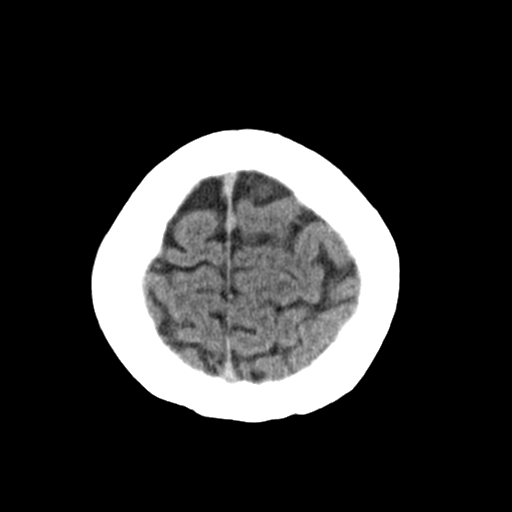
[im 28/32  brain]
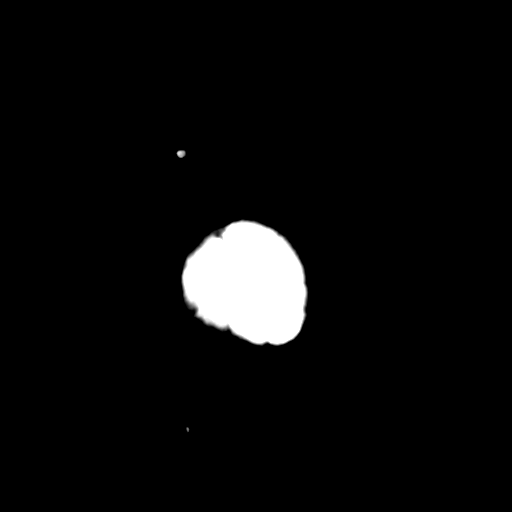

[Series 3: head bone · axial · 0.38mm/px · z∈[-432,-400]mm · 3 of 79 slices shown]
[im 8/79  bone]
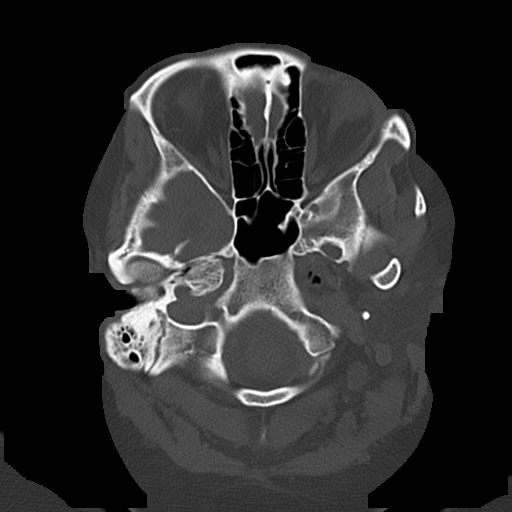
[im 16/79  bone]
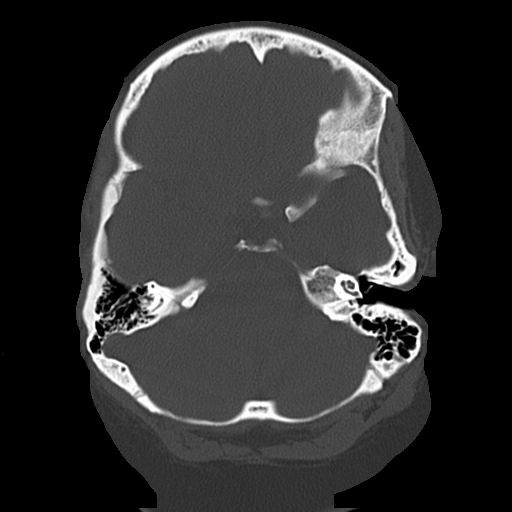
[im 24/79  bone]
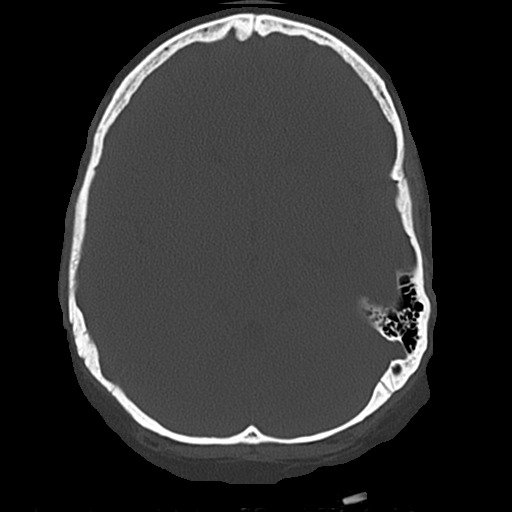

[Series 4: coronal soft · coronal · 0.29mm/px · 3 of 67 slices shown]
[im 23/67  brain]
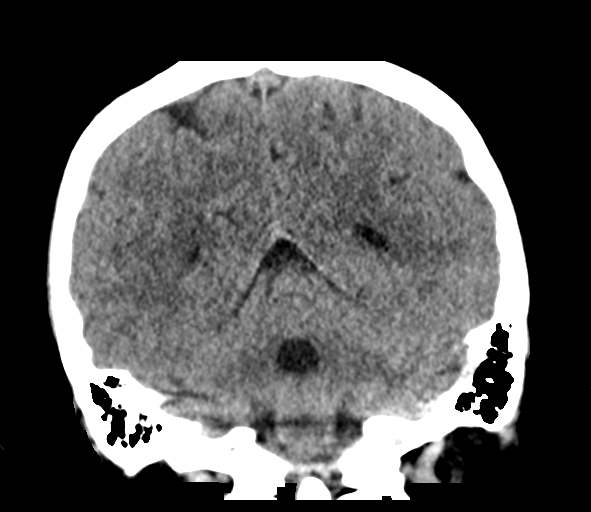
[im 30/67  brain]
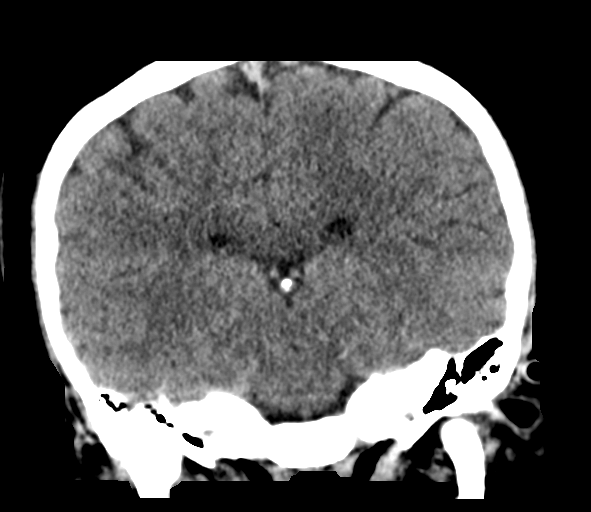
[im 37/67  brain]
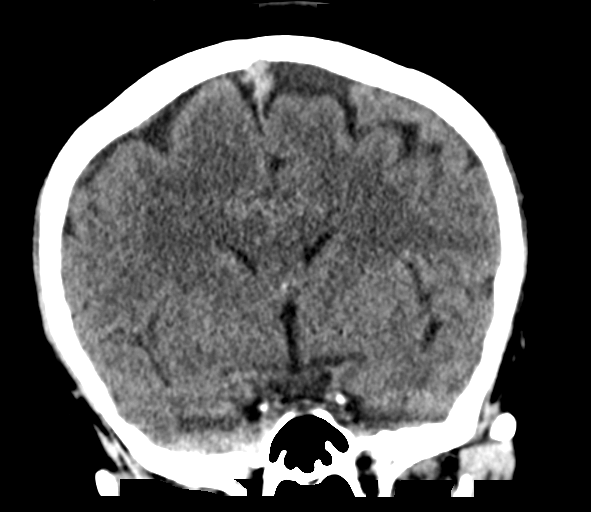

[Series 5: sagittal soft · sagittal · 0.30mm/px · 3 of 58 slices shown]
[im 20/58  brain]
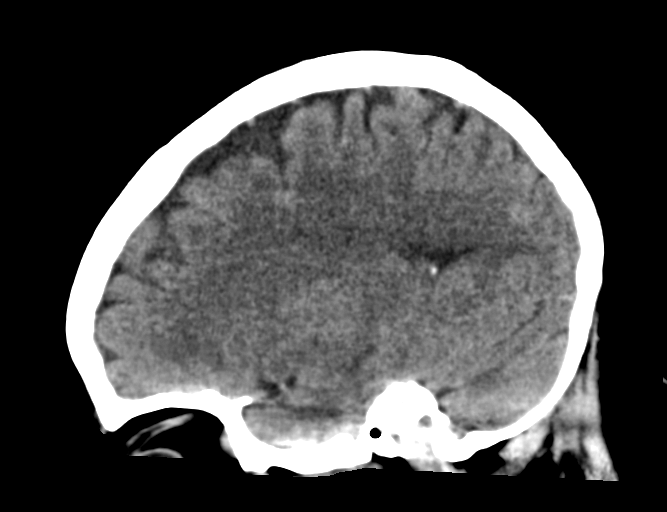
[im 29/58  brain]
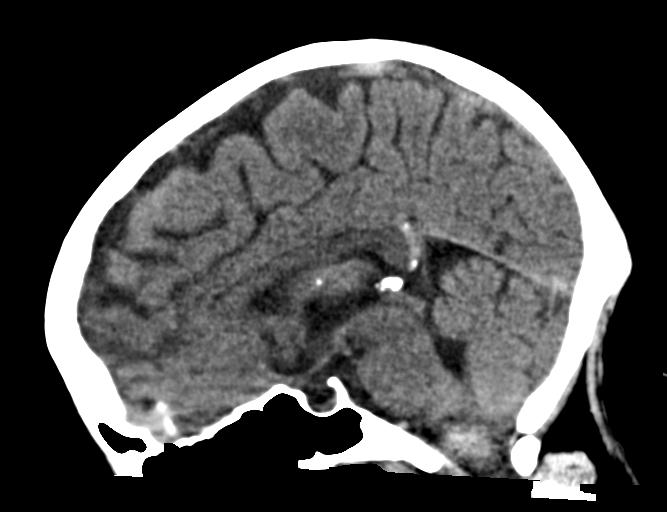
[im 39/58  brain]
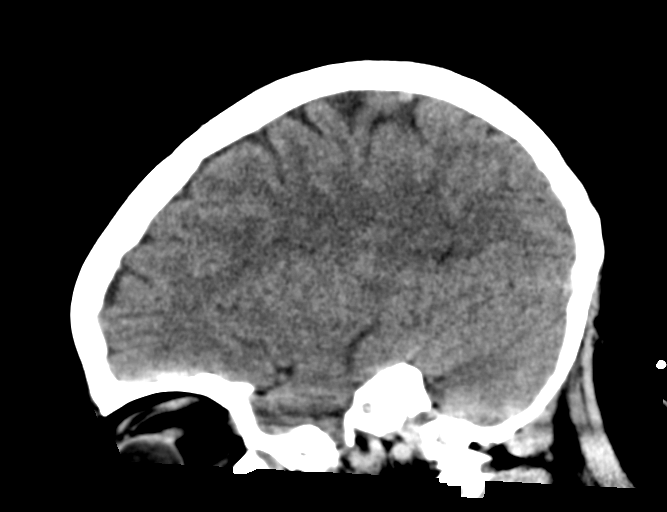

[16 of 47 positions shown; findings below may reference images not displayed]

FINDINGS: Brain: There is no mass, hemorrhage or extra-axial collection. The
size and configuration of the ventricles and extra-axial CSF spaces
are normal. The brain parenchyma is normal, without acute or chronic
infarction.

Vascular: No abnormal hyperdensity of the major intracranial
arteries or dural venous sinuses. No intracranial atherosclerosis.

Skull: The visualized skull base, calvarium and extracranial soft
tissues are normal.

Sinuses/Orbits: No fluid levels or advanced mucosal thickening of
the visualized paranasal sinuses. No mastoid or middle ear effusion.
The orbits are normal.
IMPRESSION: Normal head CT.

## 2021-08-27 NOTE — ED Provider Notes (Signed)
[facility] EMERGENCY DEPT Provider Note   CSN: [id number] Arrival date & time: 08/26/21  2147     History  Chief Complaint  Patient presents with   bilateral hand numbness    Kristen Pacheco is a 21 y.o. female.  The history is provided by the patient.  Illness Location:  Hands Quality:  Tingling and feeling anxious Severity:  Moderate Onset quality:  Gradual Duration:  2 weeks Timing:  Intermittent Progression:  Waxing and waning Chronicity:  Recurrent Context:  Has known anxiety Relieved by:  Nothing Worsened by:  Nothing Ineffective treatments:  None Associated symptoms: no abdominal pain, no chest pain, no congestion, no cough, no diarrhea, no ear pain, no fatigue, no fever, no headaches, no loss of consciousness, no myalgias, no nausea, no rash, no rhinorrhea, no shortness of breath, no sore throat, no vomiting and no wheezing   Risk factors:  Anxiety OCD and depression      Home Medications Prior to Admission medications   Medication Sig Start Date End Date Taking? Authorizing Provider  buPROPion (WELLBUTRIN XL) 300 MG 24 hr tablet Take 300 mg by mouth every morning. 06/07/19   [provider]  ibuprofen (ADVIL,MOTRIN) 100 MG/5ML suspension Take 30 mLs (600 mg total) by mouth every 8 (eight) hours as needed for mild pain or moderate pain. 10/19/15   Trixie Dredge, PA-C  potassium chloride (KLOR-CON) 10 MEQ tablet Take 2 tablets (20 mEq total) by mouth daily for 5 days. 09/08/19 09/13/19  Farrel Gordon, PA-C      Allergies    Patient has no known allergies.    Review of Systems   Review of Systems  Constitutional:  Negative for fatigue and fever.  HENT:  Negative for congestion, ear pain, rhinorrhea and sore throat.   Eyes:  Negative for photophobia.  Respiratory:  Negative for cough, shortness of breath and wheezing.   Cardiovascular:  Negative for chest pain.  Gastrointestinal:  Negative for abdominal pain, diarrhea, nausea and vomiting.   Musculoskeletal:  Negative for back pain, myalgias, neck pain and neck stiffness.  Skin:  Negative for rash.  Neurological:  Negative for dizziness, loss of consciousness, facial asymmetry, speech difficulty, weakness, numbness and headaches.  Psychiatric/Behavioral:  The patient is nervous/anxious.   All other systems reviewed and are negative.   Physical Exam Updated Vital Signs BP (!) 135/111   Pulse (!) 115   Temp 98 F (36.7 C) (Oral)   Resp 18   Ht 5\' 3"  (1.6 m)   Wt 127 kg   LMP 07/26/2021 (Exact Date)   SpO2 100%   BMI 49.60 kg/m  Physical Exam Vitals and nursing note reviewed.  Constitutional:      General: She is not in acute distress.    Appearance: Normal appearance. She is well-developed. She is not ill-appearing.  HENT:     Head: Normocephalic and atraumatic.     Mouth/Throat:     Mouth: Mucous membranes are moist.  Eyes:     Extraocular Movements: Extraocular movements intact.     Pupils: Pupils are equal, round, and reactive to light.     Comments: Normal appearance  Cardiovascular:     Rate and Rhythm: Normal rate and regular rhythm.  Pulmonary:     Effort: Pulmonary effort is normal. No respiratory distress.     Breath sounds: Normal breath sounds.  Abdominal:     General: Bowel sounds are normal. There is no distension.     Palpations: Abdomen is soft. There  is no mass.     Tenderness: There is no abdominal tenderness. There is no guarding or rebound.  Genitourinary:    Comments: No CVA tenderness Musculoskeletal:        General: Normal range of motion.     Cervical back: Normal range of motion.  Skin:    General: Skin is warm and dry.     Findings: No rash.  Neurological:     General: No focal deficit present.     Mental Status: She is alert and oriented to person, place, and time.     Cranial Nerves: No cranial nerve deficit.     Sensory: No sensory deficit.     Motor: No weakness.     Coordination: Coordination normal.     Deep Tendon  Reflexes: Reflexes normal.     Comments: Sensation intact to confrontation to all nerve distributions.    Psychiatric:        Mood and Affect: Mood is anxious.        Behavior: Behavior normal.     ED Results / Procedures / Treatments   Labs (all labs ordered are listed, but only abnormal results are displayed) Labs Reviewed - No data to display  EKG None  Radiology CT Head Wo Contrast  Result Date: 08/27/2021 CLINICAL DATA:  Bilateral hand numbness and nausea EXAM: CT HEAD WITHOUT CONTRAST TECHNIQUE: Contiguous axial images were obtained from the base of the skull through the vertex without intravenous contrast. RADIATION DOSE REDUCTION: This exam was performed according to the departmental dose-optimization program which includes automated exposure control, adjustment of the mA and/or kV according to patient size and/or use of iterative reconstruction technique. COMPARISON:  None Available. FINDINGS: Brain: There is no mass, hemorrhage or extra-axial collection. The size and configuration of the ventricles and extra-axial CSF spaces are normal. The brain parenchyma is normal, without acute or chronic infarction. Vascular: No abnormal hyperdensity of the major intracranial arteries or dural venous sinuses. No intracranial atherosclerosis. Skull: The visualized skull base, calvarium and extracranial soft tissues are normal. Sinuses/Orbits: No fluid levels or advanced mucosal thickening of the visualized paranasal sinuses. No mastoid or middle ear effusion. The orbits are normal. IMPRESSION: Normal head CT. Electronically Signed   By: Deatra Robinson M.D.   On: 08/27/2021 01:03    Procedures Procedures    Medications Ordered in ED Medications - No data to display  ED Course/ Medical Decision Making/ A&P                           Medical Decision Making Paresthesias of hands   Amount and/or Complexity of Data Reviewed Independent Historian: friend    Details: see above External Data  Reviewed: notes.    Details: previous notes reviewed. Radiology: ordered and independent interpretation performed.    Details: CT head normal  Risk Risk Details: Patient thinks it is anxiety as swell.  I have done a head CT to reassure the patient.  I have advised close follow up with PMD.  Neuro exam is completely normal.  Stable for discharge with close follow up     Final Clinical Impression(s) / ED Diagnoses Final diagnoses:  Anxiety   Return for intractable cough, coughing up blood, fevers > 100.4 unrelieved by medication, shortness of breath, intractable vomiting, chest pain, shortness of breath, weakness, numbness, changes in speech, facial asymmetry, abdominal pain, passing out, Inability to tolerate liquids or food, cough, altered mental status or any concerns.  No signs of systemic illness or infection. The patient is nontoxic-appearing on exam and vital signs are within normal limits.  I have reviewed the triage vital signs and the nursing notes. Pertinent labs & imaging results that were available during my care of the patient were reviewed by me and considered in my medical decision making (see chart for details). After history, exam, and medical workup I feel the patient has been appropriately medically screened and is safe for discharge home. Pertinent diagnoses were discussed with the patient. Patient was given return precautions Rx / DC Orders ED Discharge Orders     None         Luwana Butrick, MD 08/27/21 [id number]

## 2021-08-29 ENCOUNTER — Emergency Department (HOSPITAL_COMMUNITY)
Admission: EM | Admit: 2021-08-29 | Discharge: 2021-08-30 | Disposition: A | Discharge status: Home / Self Care | Payer: Medicaid Other | Attending: Emergency Medicine | Admitting: Emergency Medicine

## 2021-08-29 ENCOUNTER — Other Ambulatory Visit: Payer: Self-pay

## 2021-08-29 ENCOUNTER — Encounter (HOSPITAL_COMMUNITY): Payer: Self-pay | Admitting: Emergency Medicine

## 2021-08-29 DIAGNOSIS — E860 Dehydration: Secondary | ICD-10-CM

## 2021-08-29 DIAGNOSIS — R110 Nausea: Secondary | ICD-10-CM | POA: Diagnosis present

## 2021-08-29 DIAGNOSIS — R200 Anesthesia of skin: Secondary | ICD-10-CM | POA: Diagnosis not present

## 2021-08-29 DIAGNOSIS — R531 Weakness: Secondary | ICD-10-CM | POA: Diagnosis not present

## 2021-08-29 LAB — COMPREHENSIVE METABOLIC PANEL
ALT: 99 U/L — ABNORMAL HIGH (ref 0–44)
AST: 74 U/L — ABNORMAL HIGH (ref 15–41)
Albumin: 3.7 g/dL (ref 3.5–5.0)
Alkaline Phosphatase: 78 U/L (ref 38–126)
Anion gap: 17 — ABNORMAL HIGH (ref 5–15)
BUN: 5 mg/dL — ABNORMAL LOW (ref 6–20)
CO2: 22 mmol/L (ref 22–32)
Calcium: 9.2 mg/dL (ref 8.9–10.3)
Chloride: 97 mmol/L — ABNORMAL LOW (ref 98–111)
Creatinine, Ser: 0.91 mg/dL (ref 0.44–1.00)
GFR, Estimated: >60 mL/min (ref >60)
Glucose, Bld: 87 mg/dL (ref 70–99)
Potassium: 3.4 mmol/L — ABNORMAL LOW (ref 3.5–5.1)
Sodium: 136 mmol/L (ref 135–145)
Total Bilirubin: 1.6 mg/dL — ABNORMAL HIGH (ref 0.3–1.2)
Total Protein: 8.6 g/dL — ABNORMAL HIGH (ref 6.5–8.1)

## 2021-08-29 LAB — CBC WITH DIFFERENTIAL/PLATELET
Abs Immature Granulocytes: 0.02 10*3/uL (ref 0.00–0.07)
Basophils Absolute: 0.1 10*3/uL (ref 0.0–0.1)
Basophils Relative: 1 %
Eosinophils Absolute: 0 10*3/uL (ref 0.0–0.5)
Eosinophils Relative: 0 %
HCT: 44.1 % (ref 36.0–46.0)
Hemoglobin: 14.3 g/dL (ref 12.0–15.0)
Immature Granulocytes: 0 %
Lymphocytes Relative: 23 %
Lymphs Abs: 1.7 10*3/uL (ref 0.7–4.0)
MCH: 25 pg — ABNORMAL LOW (ref 26.0–34.0)
MCHC: 32.4 g/dL (ref 30.0–36.0)
MCV: 77.2 fL — ABNORMAL LOW (ref 80.0–100.0)
Monocytes Absolute: 0.9 10*3/uL (ref 0.1–1.0)
Monocytes Relative: 12 %
Neutro Abs: 4.8 10*3/uL (ref 1.7–7.7)
Neutrophils Relative %: 64 %
Platelets: 557 10*3/uL — ABNORMAL HIGH (ref 150–400)
RBC: 5.71 MIL/uL — ABNORMAL HIGH (ref 3.87–5.11)
RDW: 16.4 % — ABNORMAL HIGH (ref 11.5–15.5)
WBC: 7.5 10*3/uL (ref 4.0–10.5)
nRBC: 0 % (ref 0.0–0.2)

## 2021-08-29 NOTE — ED Provider Triage Note (Signed)
Emergency Medicine Provider Triage Evaluation Note  Kristen Pacheco , a 21 y.o. female  was evaluated in triage.  Pt complains of bilateral hand numbness, left lower extremity numbness.  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  Patient was diagnosed in 2021.  Patient had strong support system around her in college however she graduated in 2022 and since then has not had a dietitian.  The patient states that she has not eaten a substantial meal in 3 weeks she has been subsisting on potatoes and [place] fries.  The patient states that she has excessive worry and anxiety about having nausea and vomiting which causes her to not eat food.  Patient denies chest pain, shortness of breath, fevers.  Review of Systems  Positive:  Negative:   Physical Exam  BP (!) 161/128 (BP Location: Left Arm)   Pulse (!) 122   Temp 98.1 F (36.7 C)   Resp 18   LMP 07/26/2021 (Exact Date)   SpO2 97%  Gen:   Awake, no distress   Resp:  Normal effort  MSK:   Moves extremities without difficulty  Other:    Medical Decision Making  Medically screening exam initiated at 10:30 PM.  Appropriate orders placed.  DIONE PETRON was informed that the remainder of the evaluation will be completed by another provider, this initial triage assessment does not replace that evaluation, and the importance of remaining in the ED until their evaluation is complete.     Al Decant, PA-C 08/29/21 2231

## 2021-08-29 NOTE — ED Triage Notes (Addendum)
   Patient has a hx of ARFID eating disorder.  Patient was feeling nauseous on Saturday (5/28) and she has not eaten anything since then.  Patient states she feels weak and her hands feel tingly.  Intermittent dizziness.  Pain 6/10, tingling in hands.

## 2021-08-30 LAB — URINALYSIS, ROUTINE W REFLEX MICROSCOPIC
Bacteria, UA: NONE SEEN
Glucose, UA: NEGATIVE mg/dL
Ketones, ur: 80 mg/dL — AB
Nitrite: NEGATIVE
Protein, ur: 30 mg/dL — AB
Specific Gravity, Urine: 1.02 (ref 1.005–1.030)
pH: 6 (ref 5.0–8.0)

## 2021-08-30 LAB — MAGNESIUM: Magnesium: 2.1 mg/dL (ref 1.7–2.4)

## 2021-08-30 MED ORDER — PROMETHAZINE HCL 12.5 MG RE SUPP: 12.5000 mg | Freq: Four times a day (QID) | RECTAL | 0 refills | Status: DC | PRN

## 2021-08-30 MED ORDER — ONDANSETRON HCL 4 MG/5ML PO SOLN: 4.0000 mg | Freq: Four times a day (QID) | ORAL | 0 refills | Status: DC | PRN

## 2021-08-30 MED ORDER — LORAZEPAM 2 MG/ML IJ SOLN
1.0000 mg | Freq: Once | INTRAMUSCULAR | Status: AC
  Administered 2021-08-30: 1 mg via INTRAVENOUS
  Filled 2021-08-30: qty 1

## 2021-08-30 MED ORDER — SODIUM CHLORIDE 0.9 % IV BOLUS
1000.0000 mL | Freq: Once | INTRAVENOUS | Status: AC
  Administered 2021-08-30: 1000 mL via INTRAVENOUS

## 2021-08-30 MED ORDER — ONDANSETRON 8 MG PO TBDP: 8.0000 mg | ORAL_TABLET | Freq: Once | ORAL | Status: DC

## 2021-08-30 MED ORDER — ONDANSETRON HCL 4 MG/2ML IJ SOLN
4.0000 mg | Freq: Once | INTRAMUSCULAR | Status: AC
  Administered 2021-08-30: 4 mg via INTRAVENOUS
  Filled 2021-08-30: qty 2

## 2021-08-30 NOTE — ED Provider Notes (Signed)
Received signout from previous provider, please see his note for complete H&P.  This is a 21 year old female presenting with complaints of persistent nausea, decreased appetite ongoing for nearly a month.  Also endorsed occasional tearing sensation in her hands and tremulous.  She has had this problem in the past and she has a history of eating disorder.  Labs was obtained, independently viewed interpreted by me.  Patient appears to be dehydrated as her urinalysis shows 80 ketones.  The remainder of her lab work appears fairly normal.  Magnesium level was checked and it was normal.  Patient received IV fluid in the ED.  She also received Ativan as well.  She is able to tolerate p.o.  At this time, encourage patient to find a primary care provider for outpatient management of her condition.  Will discharge home with Zofran.  Return precaution given.  BP 103/80   Pulse 94   Temp 98.1 F (36.7 C)   Resp 18   LMP 07/26/2021 (Exact Date)   SpO2 100%   Results for orders placed or performed during the hospital encounter of 08/29/21  CBC with Differential  Result Value Ref Range   WBC 7.5 4.0 - 10.5 K/uL   RBC 5.71 (H) 3.87 - 5.11 MIL/uL   Hemoglobin 14.3 12.0 - 15.0 g/dL   HCT 37.1 69.6 - 78.9 %   MCV 77.2 (L) 80.0 - 100.0 fL   MCH 25.0 (L) 26.0 - 34.0 pg   MCHC 32.4 30.0 - 36.0 g/dL   RDW 38.1 (H) 01.7 - 51.0 %   Platelets 557 (H) 150 - 400 K/uL   nRBC 0.0 0.0 - 0.2 %   Neutrophils Relative % 64 %   Neutro Abs 4.8 1.7 - 7.7 K/uL   Lymphocytes Relative 23 %   Lymphs Abs 1.7 0.7 - 4.0 K/uL   Monocytes Relative 12 %   Monocytes Absolute 0.9 0.1 - 1.0 K/uL   Eosinophils Relative 0 %   Eosinophils Absolute 0.0 0.0 - 0.5 K/uL   Basophils Relative 1 %   Basophils Absolute 0.1 0.0 - 0.1 K/uL   Immature Granulocytes 0 %   Abs Immature Granulocytes 0.02 0.00 - 0.07 K/uL  Comprehensive metabolic panel  Result Value Ref Range   Sodium 136 135 - 145 mmol/L   Potassium 3.4 (L) 3.5 - 5.1 mmol/L    Chloride 97 (L) 98 - 111 mmol/L   CO2 22 22 - 32 mmol/L   Glucose, Bld 87 70 - 99 mg/dL   BUN 5 (L) 6 - 20 mg/dL   Creatinine, Ser 2.58 0.44 - 1.00 mg/dL   Calcium 9.2 8.9 - 52.7 mg/dL   Total Protein 8.6 (H) 6.5 - 8.1 g/dL   Albumin 3.7 3.5 - 5.0 g/dL   AST 74 (H) 15 - 41 U/L   ALT 99 (H) 0 - 44 U/L   Alkaline Phosphatase 78 38 - 126 U/L   Total Bilirubin 1.6 (H) 0.3 - 1.2 mg/dL   GFR, Estimated >78 >24 mL/min   Anion gap 17 (H) 5 - 15  Urinalysis, Routine w reflex microscopic  Result Value Ref Range   Color, Urine AMBER (A) YELLOW   APPearance HAZY (A) CLEAR   Specific Gravity, Urine 1.020 1.005 - 1.030   pH 6.0 5.0 - 8.0   Glucose, UA NEGATIVE NEGATIVE mg/dL   Hgb urine dipstick SMALL (A) NEGATIVE   Bilirubin Urine SMALL (A) NEGATIVE   Ketones, ur 80 (A) NEGATIVE mg/dL   Protein,  ur 30 (A) NEGATIVE mg/dL   Nitrite NEGATIVE NEGATIVE   Leukocytes,Ua TRACE (A) NEGATIVE   RBC / HPF 0-5 0 - 5 RBC/hpf   WBC, UA 0-5 0 - 5 WBC/hpf   Bacteria, UA NONE SEEN NONE SEEN   Mucus PRESENT   Magnesium  Result Value Ref Range   Magnesium 2.1 1.7 - 2.4 mg/dL   CT Head Wo Contrast  Result Date: 08/27/2021 CLINICAL DATA:  Bilateral hand numbness and nausea EXAM: CT HEAD WITHOUT CONTRAST TECHNIQUE: Contiguous axial images were obtained from the base of the skull through the vertex without intravenous contrast. RADIATION DOSE REDUCTION: This exam was performed according to the departmental dose-optimization program which includes automated exposure control, adjustment of the mA and/or kV according to patient size and/or use of iterative reconstruction technique. COMPARISON:  None Available. FINDINGS: Brain: There is no mass, hemorrhage or extra-axial collection. The size and configuration of the ventricles and extra-axial CSF spaces are normal. The brain parenchyma is normal, without acute or chronic infarction. Vascular: No abnormal hyperdensity of the major intracranial arteries or dural venous  sinuses. No intracranial atherosclerosis. Skull: The visualized skull base, calvarium and extracranial soft tissues are normal. Sinuses/Orbits: No fluid levels or advanced mucosal thickening of the visualized paranasal sinuses. No mastoid or middle ear effusion. The orbits are normal. IMPRESSION: Normal head CT. Electronically Signed   By: Deatra Robinson M.D.   On: 08/27/2021 01:03       Fayrene Helper, PA-C 08/30/21 [id number]    Linwood Dibbles, MD 08/30/21 1511

## 2021-08-30 NOTE — ED Notes (Signed)
Gave water and crackers for PO challenge

## 2021-08-30 NOTE — ED Provider Notes (Signed)
[facility]-EMERGENCY DEPT Provider Note   CSN: [id number] Arrival date & time: 08/29/21  2155     History  Chief Complaint  Patient presents with   Nausea   Weakness    Kristen Pacheco is a 21 y.o. female.  HPI  With medical history including depression, no CAD, avoidance/restrictive food intake disorder presents with complaints of feeling nauseous since May.  Patient states that sometimes last month she was eating some food and started to feel nauseous, since then she has been unable to eat much food because  she is afraid that she will vomit.  She endorses that she has not eaten much over the last 3 weeks time, she still tolerating p.o., she will have occasional stomach pain currently none  at this time, she still passing gas and having bowel movements last movement was yesterday, denies any urinary symptoms, no fevers chills general body aches.  Patient states that she has occasional paresthesias in her hands bilaterally she currently has none at this time, she does not endorse any suicidal homicidal ideations.     Home Medications Prior to Admission medications   Medication Sig Start Date End Date Taking? Authorizing Provider  ondansetron [facility]) 4 MG/5ML solution Take 5 mLs (4 mg total) by mouth every 6 (six) hours as needed for nausea or vomiting. 08/30/21  Yes Carroll Sage, PA-C  promethazine (PHENERGAN) 12.5 MG suppository Place 1 suppository (12.5 mg total) rectally every 6 (six) hours as needed for up to 10 doses for nausea or vomiting. 08/30/21  Yes Carroll Sage, PA-C  ibuprofen (ADVIL,MOTRIN) 100 MG/5ML suspension Take 30 mLs (600 mg total) by mouth every 8 (eight) hours as needed for mild pain or moderate pain. Patient not taking: Reported on 08/30/2021 10/19/15   Trixie Dredge, PA-C  potassium chloride (KLOR-CON) 10 MEQ tablet Take 2 tablets (20 mEq total) by mouth daily for 5 days. 09/08/19 09/13/19  Farrel Gordon, PA-C      Allergies     Patient has no known allergies.    Review of Systems   Review of Systems  Constitutional:  Negative for chills and fever.  Respiratory:  Negative for shortness of breath.   Cardiovascular:  Negative for chest pain.  Gastrointestinal:  Positive for nausea. Negative for abdominal pain and vomiting.  Neurological:  Negative for headaches.    Physical Exam Updated Vital Signs BP 102/82   Pulse (!) 105   Temp 98.1 F (36.7 C)   Resp 18   LMP 07/26/2021 (Exact Date)   SpO2 99%  Physical Exam Vitals and nursing note reviewed.  Constitutional:      General: She is not in acute distress.    Appearance: She is not ill-appearing.  HENT:     Head: Normocephalic and atraumatic.     Nose: No congestion.     Mouth/Throat:     Mouth: Mucous membranes are moist.     Pharynx: Oropharynx is clear.  Eyes:     Extraocular Movements: Extraocular movements intact.     Conjunctiva/sclera: Conjunctivae normal.     Pupils: Pupils are equal, round, and reactive to light.  Cardiovascular:     Rate and Rhythm: Normal rate and regular rhythm.     Pulses: Normal pulses.     Heart sounds: No murmur heard.    No friction rub. No gallop.  Pulmonary:     Effort: No respiratory distress.     Breath sounds: No wheezing, rhonchi or rales.  Abdominal:  Palpations: Abdomen is soft.     Tenderness: There is no abdominal tenderness. There is no right CVA tenderness or left CVA tenderness.  Skin:    General: Skin is warm and dry.  Neurological:     Mental Status: She is alert.     Comments: No facial asymmetry no difficulty with word following following two-step commands no unilateral weakness present gait fully intact.  Psychiatric:        Mood and Affect: Mood normal.     ED Results / Procedures / Treatments   Labs (all labs ordered are listed, but only abnormal results are displayed) Labs Reviewed  CBC WITH DIFFERENTIAL/PLATELET - Abnormal; Notable for the following components:      Result  Value   RBC 5.71 (*)    MCV 77.2 (*)    MCH 25.0 (*)    RDW 16.4 (*)    Platelets 557 (*)    All other components within normal limits  COMPREHENSIVE METABOLIC PANEL - Abnormal; Notable for the following components:   Potassium 3.4 (*)    Chloride 97 (*)    BUN 5 (*)    Total Protein 8.6 (*)    AST 74 (*)    ALT 99 (*)    Total Bilirubin 1.6 (*)    Anion gap 17 (*)    All other components within normal limits  URINALYSIS, ROUTINE W REFLEX MICROSCOPIC - Abnormal; Notable for the following components:   Color, Urine AMBER (*)    APPearance HAZY (*)    Hgb urine dipstick SMALL (*)    Bilirubin Urine SMALL (*)    Ketones, ur 80 (*)    Protein, ur 30 (*)    Leukocytes,Ua TRACE (*)    All other components within normal limits  MAGNESIUM    EKG None  Radiology No results found.  Procedures Procedures    Medications Ordered in ED Medications  ondansetron (ZOFRAN-ODT) disintegrating tablet 8 mg (has no administration in time range)  ondansetron (ZOFRAN) injection 4 mg (4 mg Intravenous Given 08/30/21 0612)  sodium chloride 0.9 % bolus 1,000 mL (1,000 mLs Intravenous New Bag/Given 08/30/21 0612)    ED Course/ Medical Decision Making/ A&P                           Medical Decision Making Amount and/or Complexity of Data Reviewed Labs: ordered.  Risk Prescription drug management.   This patient presents to the ED for concern of nausea, this involves an extensive number of treatment options, and is a complaint that carries with it a high risk of complications and morbidity.  The differential diagnosis includes metabolic derailment, bowel obstruction, psychiatric emergency    Additional history obtained:  Additional history obtained from partner at bedside External records from outside source obtained and reviewed including previous PCP notes, dietary notes   Co morbidities that complicate the patient evaluation  Obesity, eating disorder  Social Determinants of  Health:  N/A    Lab Tests:  I Ordered, and personally interpreted labs.  The pertinent results include: CBC unremarkable, CMP shows potassium 3.4 chloride 97 AST 74, ALT 99 at baseline for patient, total T. bili 1.6 at baseline, anion gap 17, UA shows ketones, trace leukocytes no red blood cells white blood cells   Imaging Studies ordered:  I ordered imaging studies including N/A I independently visualized and interpreted imaging which showed N/A I agree with the radiologist interpretation   Cardiac Monitoring:  The patient was maintained on a cardiac monitor.  I personally viewed and interpreted the cardiac monitored which showed an underlying rhythm of: Without signs of ischemia   Medicines ordered and prescription drug management:  I ordered medication including fluids, antiemetics I have reviewed the patients home medicines and have made adjustments as needed  Critical Interventions:  N/A   Reevaluation:  Presents with nausea, triage obtained basic lab work which I personally reviewed, all of which is at baseline, she does have a slight increase in her anion gap, abdomen was soft nontender, will provide her with fluids, antiemetics, p.o. challenge and reassess.  Consultations Obtained:  N/A   Test Considered:  CT abdomen and pelvis-we will defer suspicion for intra-abdominal abnormality is very low at this time, no leukocytosis, no metabolic derailment, nonsurgical abdomen.    Rule out I have low suspicion for intracranial head bleed or CVA patient is not on anticoag's, no recent head trauma, no neurodeficits present my exam.  I have low suspicion for psychiatric emergency does not endorse suicidal homicidal ideations she is responding appropriately does not appear to respond to internal stimuli.  I have low suspicion for bowel obstruction/volvulus abdomen soft nontender still passing gas and having normal bowel movements.  I low suspicion for UTI, pyelo-, kidney  stone she denies any urinary symptoms, UA is negative for signs of infection.   Dispostion and problem list  Due to shift change patient be handed off to Fayrene Helper [facility]   Follow-up on magnesium, p.o. challenge the patient, as long as she is tolerating p.o. can be discharged home, sent home with antiemetics and amatory referral with social work to help assist with eating disorders.            Final Clinical Impression(s) / ED Diagnoses Final diagnoses:  Nausea    Rx / DC Orders ED Discharge Orders          Ordered    Ambulatory referral to Social Work       Comments: Has an eating  disorder and needs help finding a primary care provider and/or therapist.   08/30/21 0620    ondansetron (ZOFRAN) 4 MG/5ML solution  Every 6 hours PRN        08/30/21 0623    promethazine (PHENERGAN) 12.5 MG suppository  Every 6 hours PRN        08/30/21 0623              Carroll Sage, PA-C 08/30/21 9528    Paula Libra, MD 08/30/21 2238

## 2021-09-02 ENCOUNTER — Inpatient Hospital Stay (HOSPITAL_COMMUNITY)
Admission: EM | Admit: 2021-09-02 | Discharge: 2021-09-09 | DRG: 392 | Disposition: A | Discharge status: Home / Self Care | Payer: Medicaid Other | Attending: Student | Admitting: Student

## 2021-09-02 ENCOUNTER — Encounter (HOSPITAL_COMMUNITY): Payer: Self-pay

## 2021-09-02 ENCOUNTER — Other Ambulatory Visit: Payer: Self-pay

## 2021-09-02 DIAGNOSIS — Z560 Unemployment, unspecified: Secondary | ICD-10-CM

## 2021-09-02 DIAGNOSIS — R110 Nausea: Secondary | ICD-10-CM | POA: Diagnosis present

## 2021-09-02 DIAGNOSIS — F39 Unspecified mood [affective] disorder: Secondary | ICD-10-CM

## 2021-09-02 DIAGNOSIS — Z8249 Family history of ischemic heart disease and other diseases of the circulatory system: Secondary | ICD-10-CM

## 2021-09-02 DIAGNOSIS — R7989 Other specified abnormal findings of blood chemistry: Secondary | ICD-10-CM | POA: Diagnosis present

## 2021-09-02 DIAGNOSIS — F5082 Avoidant/restrictive food intake disorder: Secondary | ICD-10-CM

## 2021-09-02 DIAGNOSIS — R112 Nausea with vomiting, unspecified: Principal | ICD-10-CM | POA: Diagnosis present

## 2021-09-02 DIAGNOSIS — F429 Obsessive-compulsive disorder, unspecified: Secondary | ICD-10-CM | POA: Diagnosis present

## 2021-09-02 DIAGNOSIS — Z9049 Acquired absence of other specified parts of digestive tract: Secondary | ICD-10-CM

## 2021-09-02 DIAGNOSIS — R739 Hyperglycemia, unspecified: Secondary | ICD-10-CM | POA: Diagnosis present

## 2021-09-02 DIAGNOSIS — R319 Hematuria, unspecified: Secondary | ICD-10-CM | POA: Diagnosis present

## 2021-09-02 DIAGNOSIS — F32A Depression, unspecified: Secondary | ICD-10-CM | POA: Diagnosis present

## 2021-09-02 DIAGNOSIS — R8281 Pyuria: Secondary | ICD-10-CM | POA: Diagnosis present

## 2021-09-02 DIAGNOSIS — Z8349 Family history of other endocrine, nutritional and metabolic diseases: Secondary | ICD-10-CM

## 2021-09-02 DIAGNOSIS — R202 Paresthesia of skin: Secondary | ICD-10-CM | POA: Diagnosis present

## 2021-09-02 DIAGNOSIS — E876 Hypokalemia: Secondary | ICD-10-CM

## 2021-09-02 DIAGNOSIS — Z833 Family history of diabetes mellitus: Secondary | ICD-10-CM

## 2021-09-02 DIAGNOSIS — E162 Hypoglycemia, unspecified: Secondary | ICD-10-CM | POA: Diagnosis not present

## 2021-09-02 DIAGNOSIS — R109 Unspecified abdominal pain: Secondary | ICD-10-CM | POA: Diagnosis present

## 2021-09-02 DIAGNOSIS — F410 Panic disorder [episodic paroxysmal anxiety] without agoraphobia: Secondary | ICD-10-CM | POA: Diagnosis present

## 2021-09-02 DIAGNOSIS — Z6841 Body Mass Index (BMI) 40.0 and over, adult: Secondary | ICD-10-CM

## 2021-09-02 LAB — CBC WITH DIFFERENTIAL/PLATELET
Abs Immature Granulocytes: 0.03 10*3/uL (ref 0.00–0.07)
Basophils Absolute: 0.1 10*3/uL (ref 0.0–0.1)
Basophils Relative: 1 %
Eosinophils Absolute: 0 10*3/uL (ref 0.0–0.5)
Eosinophils Relative: 0 %
HCT: 41.3 % (ref 36.0–46.0)
Hemoglobin: 13.1 g/dL (ref 12.0–15.0)
Immature Granulocytes: 0 %
Lymphocytes Relative: 21 %
Lymphs Abs: 1.6 10*3/uL (ref 0.7–4.0)
MCH: 25.1 pg — ABNORMAL LOW (ref 26.0–34.0)
MCHC: 31.7 g/dL (ref 30.0–36.0)
MCV: 79.3 fL — ABNORMAL LOW (ref 80.0–100.0)
Monocytes Absolute: 0.8 10*3/uL (ref 0.1–1.0)
Monocytes Relative: 11 %
Neutro Abs: 5 10*3/uL (ref 1.7–7.7)
Neutrophils Relative %: 67 %
Platelets: 442 10*3/uL — ABNORMAL HIGH (ref 150–400)
RBC: 5.21 MIL/uL — ABNORMAL HIGH (ref 3.87–5.11)
RDW: 16.2 % — ABNORMAL HIGH (ref 11.5–15.5)
WBC: 7.5 10*3/uL (ref 4.0–10.5)
nRBC: 0 % (ref 0.0–0.2)

## 2021-09-02 LAB — URINALYSIS, ROUTINE W REFLEX MICROSCOPIC
Bilirubin Urine: NEGATIVE
Glucose, UA: >=500 mg/dL — AB
Ketones, ur: 80 mg/dL — AB
Nitrite: NEGATIVE
Protein, ur: 100 mg/dL — AB
RBC / HPF: >50 RBC/hpf — ABNORMAL HIGH (ref 0–5)
Specific Gravity, Urine: 1.014 (ref 1.005–1.030)
pH: 6 (ref 5.0–8.0)

## 2021-09-02 LAB — COMPREHENSIVE METABOLIC PANEL
ALT: 102 U/L — ABNORMAL HIGH (ref 0–44)
AST: 74 U/L — ABNORMAL HIGH (ref 15–41)
Albumin: 3.6 g/dL (ref 3.5–5.0)
Alkaline Phosphatase: 76 U/L (ref 38–126)
Anion gap: 14 (ref 5–15)
BUN: 5 mg/dL — ABNORMAL LOW (ref 6–20)
CO2: 24 mmol/L (ref 22–32)
Calcium: 9.2 mg/dL (ref 8.9–10.3)
Chloride: 100 mmol/L (ref 98–111)
Creatinine, Ser: 0.94 mg/dL (ref 0.44–1.00)
GFR, Estimated: >60 mL/min (ref >60)
Glucose, Bld: 69 mg/dL — ABNORMAL LOW (ref 70–99)
Potassium: 2.7 mmol/L — CL (ref 3.5–5.1)
Sodium: 138 mmol/L (ref 135–145)
Total Bilirubin: 1.6 mg/dL — ABNORMAL HIGH (ref 0.3–1.2)
Total Protein: 7.9 g/dL (ref 6.5–8.1)

## 2021-09-02 LAB — BASIC METABOLIC PANEL
Anion gap: 10 (ref 5–15)
BUN: <5 mg/dL — ABNORMAL LOW (ref 6–20)
CO2: 21 mmol/L — ABNORMAL LOW (ref 22–32)
Calcium: 8.1 mg/dL — ABNORMAL LOW (ref 8.9–10.3)
Chloride: 109 mmol/L (ref 98–111)
Creatinine, Ser: 0.81 mg/dL (ref 0.44–1.00)
GFR, Estimated: >60 mL/min (ref >60)
Glucose, Bld: 101 mg/dL — ABNORMAL HIGH (ref 70–99)
Potassium: 3.4 mmol/L — ABNORMAL LOW (ref 3.5–5.1)
Sodium: 140 mmol/L (ref 135–145)

## 2021-09-02 LAB — LIPASE, BLOOD: Lipase: 22 U/L (ref 11–51)

## 2021-09-02 LAB — CBG MONITORING, ED: Glucose-Capillary: 224 mg/dL — ABNORMAL HIGH (ref 70–99)

## 2021-09-02 LAB — MAGNESIUM: Magnesium: 2 mg/dL (ref 1.7–2.4)

## 2021-09-02 LAB — GLUCOSE, CAPILLARY: Glucose-Capillary: 81 mg/dL (ref 70–99)

## 2021-09-02 MED ORDER — ACETAMINOPHEN 325 MG PO TABS: 650.0000 mg | ORAL_TABLET | Freq: Four times a day (QID) | ORAL | Status: DC | PRN

## 2021-09-02 MED ORDER — SODIUM CHLORIDE 0.9 % IV BOLUS
1000.0000 mL | Freq: Once | INTRAVENOUS | Status: AC
  Administered 2021-09-02: 1000 mL via INTRAVENOUS

## 2021-09-02 MED ORDER — KCL IN DEXTROSE-NACL 20-5-0.45 MEQ/L-%-% IV SOLN
INTRAVENOUS | Status: DC
  Filled 2021-09-02 (×10): qty 1000

## 2021-09-02 MED ORDER — POTASSIUM CHLORIDE CRYS ER 20 MEQ PO TBCR
20.0000 meq | EXTENDED_RELEASE_TABLET | Freq: Once | ORAL | Status: DC
  Filled 2021-09-02: qty 1

## 2021-09-02 MED ORDER — ENSURE ENLIVE PO LIQD: 237.0000 mL | Freq: Two times a day (BID) | ORAL | Status: DC

## 2021-09-02 MED ORDER — ACETAMINOPHEN 650 MG RE SUPP: 650.0000 mg | Freq: Four times a day (QID) | RECTAL | Status: DC | PRN

## 2021-09-02 MED ORDER — ENOXAPARIN SODIUM 40 MG/0.4ML IJ SOSY
40.0000 mg | PREFILLED_SYRINGE | INTRAMUSCULAR | Status: DC
  Filled 2021-09-02 (×2): qty 0.4

## 2021-09-02 MED ORDER — PROCHLORPERAZINE EDISYLATE 10 MG/2ML IJ SOLN
10.0000 mg | Freq: Four times a day (QID) | INTRAMUSCULAR | Status: DC | PRN
  Administered 2021-09-02 – 2021-09-08 (×12): 10 mg via INTRAVENOUS
  Filled 2021-09-02 (×13): qty 2

## 2021-09-02 MED ORDER — POTASSIUM CHLORIDE 10 MEQ/100ML IV SOLN
10.0000 meq | INTRAVENOUS | Status: AC
  Administered 2021-09-02 (×3): 10 meq via INTRAVENOUS
  Filled 2021-09-02 (×3): qty 100

## 2021-09-02 MED ORDER — DEXTROSE 10 % IV SOLN: INTRAVENOUS | Status: DC

## 2021-09-02 MED ORDER — DEXTROSE 50 % IV SOLN
25.0000 g | Freq: Once | INTRAVENOUS | Status: AC
  Administered 2021-09-02: 25 g via INTRAVENOUS
  Filled 2021-09-02: qty 50

## 2021-09-02 MED ORDER — PROCHLORPERAZINE EDISYLATE 10 MG/2ML IJ SOLN: 5.0000 mg | INTRAMUSCULAR | Status: DC | PRN

## 2021-09-02 NOTE — Social Work (Signed)
CSW met with Pt and sister at bedside. Pt states that she has diagnosis of ARFID eating disorder and has been unable to eat.   Pt reports that she does have Medicaid insurance but has experienced difficulty connecting with a PCP for referral to a specialized clinic.   As today is a Sunday, no referral to PCP can be made but [facility] has been consulted and will follow up tomorrow 09/03/21.  CSW confirmed address, telephone number and transportation availability with Pt. RNCM updated via chat.

## 2021-09-02 NOTE — ED Notes (Signed)
Pt had to have ultrasound IV. Did not see veins.

## 2021-09-02 NOTE — ED Notes (Signed)
Two nurses looked and attempted to draw labs without success. IV team consult in for patient.

## 2021-09-02 NOTE — ED Provider Triage Note (Signed)
Emergency Medicine Provider Triage Evaluation Note  Kristen Pacheco , a 21 y.o. female  was evaluated in triage.  Pt complains of fear of nausea and vomiting related to an eating disorder.  Only able to eat small amounts and has to stop.  Has been barely eating or not eating at all over the last 4 weeks.  Dropped weight.  Feels dehydrated.  Denies recent fevers, shortness of breath, chest pain, weakness, neck stiffness, changes in bowel or urinary habits.  Review of Systems  Positive:  Negative: As above  Physical Exam  BP (!) 147/106 (BP Location: Left Arm)   Pulse (!) 103   Temp 98.6 F (37 C) (Oral)   Ht 5\' 3"  (1.6 m)   Wt 121.6 kg   LMP 09/02/2021 (Exact Date)   SpO2 97%   BMI 47.49 kg/m  Gen:   Awake, no distress   Resp:  Normal effort  MSK:   Moves extremities without difficulty  Other:  Upper abdominal tenderness.  Abdomen soft.  No flank tenderness.  HR 90 bpm on exam.  Medical Decision Making  Medically screening exam initiated at 10:34 AM.  Appropriate orders placed.  Kristen Pacheco was informed that the remainder of the evaluation will be completed by another provider, this initial triage assessment does not replace that evaluation, and the importance of remaining in the ED until their evaluation is complete.  Offered Zofran, patient declined.   Barnie Del, PA-C 09/02/21 1042

## 2021-09-02 NOTE — Progress Notes (Signed)
Cell phone in hand

## 2021-09-02 NOTE — ED Provider Notes (Signed)
[facility]-EMERGENCY DEPT Provider Note   CSN: [id number] Arrival date & time: 09/02/21  2585     History {Add pertinent medical, surgical, social history, OB history to HPI:1} Chief Complaint  Patient presents with   Nausea    Kristen Pacheco is a 21 y.o. female.  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  She was evaluated here in the ED, 3 days ago.  At that time laboratory testing was done.  She was discharged with a prescription for Zofran.  She feels like the Zofran is not helping.  She looked on my chart and noticed that her potassium level was low at 3.4, and this concerned her so she has also presenting to discuss that.  Additionally, she is concerned about a sore spot where she had blood taken from the right antecubital space, 3 days ago.  She sees a therapist for problems that do not include eating disorder.  She is looking to be referred to a primary care doctor and a eating disorder specialist for further care and treatment.  She lives with her family members, mother and sister.  The sister is in the ED with the patient and is helping to advocate for her.  Patient feels generally weak.  She feels at a loss to know what to do with her condition.    Home Medications Prior to Admission medications   Medication Sig Start Date End Date Taking? Authorizing Provider  ibuprofen (ADVIL,MOTRIN) 100 MG/5ML suspension Take 30 mLs (600 mg total) by mouth every 8 (eight) hours as needed for mild pain or moderate pain. Patient not taking: Reported on 08/30/2021 10/19/15   Trixie Dredge, PA-C  ondansetron [facility]) 4 MG/5ML solution Take 5 mLs (4 mg total) by mouth every 6 (six) hours as needed for nausea or vomiting. 08/30/21   Fayrene Helper, PA-C  potassium chloride (KLOR-CON) 10 MEQ tablet Take 2 tablets (20 mEq total) by mouth daily for 5 days. 09/08/19 09/13/19   Farrel Gordon, PA-C  promethazine (PHENERGAN) 12.5 MG suppository Place 1 suppository (12.5 mg total) rectally every 6 (six) hours as needed for up to 10 doses for nausea or vomiting. 08/30/21   Fayrene Helper, PA-C      Allergies    Patient has no known allergies.    Review of Systems   Review of Systems  Physical Exam Updated Vital Signs BP 125/68 (BP Location: Right Arm)   Pulse 93   Temp 98.6 F (37 C) (Oral)   Resp 16   Ht 5\' 3"  (1.6 m)   Wt 121.6 kg   LMP 09/02/2021 (Exact Date)   SpO2 100%   BMI 47.49 kg/m  Physical Exam Vitals and nursing note reviewed.  Constitutional:      General: She is not in acute distress.    Appearance: She is well-developed. She is obese. She is not ill-appearing or diaphoretic.  HENT:     Head: Normocephalic and atraumatic.     Right Ear: External ear normal.     Left Ear: External ear normal.  Eyes:     Conjunctiva/sclera: Conjunctivae normal.     Pupils: Pupils are equal, round, and reactive to light.  Neck:     Trachea: Phonation normal.  Cardiovascular:     Rate and Rhythm: Normal rate.  Pulmonary:     Effort: Pulmonary effort is normal.     Breath sounds:  Normal breath sounds.  Abdominal:     General: There is no distension.  Musculoskeletal:        General: Normal range of motion.     Cervical back: Normal range of motion and neck supple.     Comments: Right antecubital space with red indurated area approximately 1 x 3 cm, likely site of prior venous access.  Findings are consistent with mild thrombophlebitis of the brachial vein.  Skin:    General: Skin is warm and dry.  Neurological:     Mental Status: She is alert and oriented to person, place, and time.     Cranial Nerves: No cranial nerve deficit.     Sensory: No sensory deficit.     Motor: No abnormal muscle tone.     Coordination: Coordination normal.  Psychiatric:        Behavior: Behavior normal.        Thought Content: Thought content normal.        Judgment:  Judgment normal.     Comments: She appears depressed.  She is tearful during the exam     ED Results / Procedures / Treatments   Labs (all labs ordered are listed, but only abnormal results are displayed) Labs Reviewed  CBC WITH DIFFERENTIAL/PLATELET - Abnormal; Notable for the following components:      Result Value   RBC 5.21 (*)    MCV 79.3 (*)    MCH 25.1 (*)    RDW 16.2 (*)    Platelets 442 (*)    All other components within normal limits  COMPREHENSIVE METABOLIC PANEL - Abnormal; Notable for the following components:   Potassium 2.7 (*)    Glucose, Bld 69 (*)    BUN 5 (*)    AST 74 (*)    ALT 102 (*)    Total Bilirubin 1.6 (*)    All other components within normal limits  LIPASE, BLOOD  MAGNESIUM  URINALYSIS, ROUTINE W REFLEX MICROSCOPIC    EKG None  Radiology No results found.  Procedures Procedures  {Document cardiac monitor, telemetry assessment procedure when appropriate:1}  Medications Ordered in ED Medications  potassium chloride SA (KLOR-CON M) CR tablet 20 mEq (20 mEq Oral Not Given 09/02/21 1304)  potassium chloride 10 mEq in 100 mL IVPB (10 mEq Intravenous New Bag/Given 09/02/21 1432)  dextrose 10 % infusion ( Intravenous New Bag/Given 09/02/21 1455)  sodium chloride 0.9 % bolus 1,000 mL (0 mLs Intravenous Stopped 09/02/21 1433)    ED Course/ Medical Decision Making/ A&P                           Medical Decision Making Patient presenting with ongoing difficulty eating well nausea and fear of eating, associated with her diagnosed eating disorder.  Evaluated here several days ago and had mild hypokalemia at that time.  She was prescribed Zofran which has not helped and she continues to have increasing trouble eating and drinking.  She has previously had a cholecystectomy.  Problems Addressed: Hypokalemia: acute illness or injury    Details: Worsening  Amount and/or Complexity of Data Reviewed Independent Historian:     Details: Cogent historian  supplemented by sister at bedside Labs: ordered.    Details: CBC, metabolic panel, lipase, magnesium, urinalysis-normal except MCV low, potassium low, glucose low, total bilirubin high Discussion of management or test interpretation with external provider(s): Consult hospitalist to admit.  Risk Prescription drug management. Decision regarding hospitalization. Risk Details: Patient  with ongoing decreased appetite, and nausea with food avoidance eating disorder.  Evaluation today consistent with worsening hypokalemia, and mild hyperglycemia.  Patient is unable to eat or drink anything in the ED.  She was treated with IV fluids for suspected dehydration and IV potassium.  She refused to take oral potassium.  Will consult hospitalist for admission for management.  She will need help with eating disorder as well.  This would be an outpatient referral.  Critical Care Total time providing critical care: 35 minutes   ***  {Document critical care time when appropriate:1} {Document review of labs and clinical decision tools ie heart score, Chads2Vasc2 etc:1}  {Document your independent review of radiology images, and any outside records:1} {Document your discussion with family members, caretakers, and with consultants:1} {Document social determinants of health affecting pt's care:1} {Document your decision making why or why not admission, treatments were needed:1} Final Clinical Impression(s) / ED Diagnoses Final diagnoses:  Hypokalemia  Hypoglycemia  Avoidant-restrictive food intake disorder (ARFID)    Rx / DC Orders ED Discharge Orders     None

## 2021-09-02 NOTE — H&P (Signed)
History and Physical    Patient: Kristen Pacheco YOV:[id number] DOB: May 11, 2000 DOA: 09/02/2021 DOS: the patient was seen and examined on 09/02/2021 PCP: Pcp, No  Patient coming from: Home  Chief Complaint:  Chief Complaint  Patient presents with   Nausea   HPI: Kristen Pacheco is a 21 y.o. 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  The patient has been having persistent nausea with pain which has brought up eating disorders.  She has not been seen by surgery, but she has been following with behavioral health.  She has a sore throat, but no rhinorrhea, dyspnea, wheezing or hemoptysis.  No chest pain, palpitations, diaphoresis, PND, orthopnea or pitting edema of the lower extremities.  No abdominal pain, nausea, emesis, diarrhea, constipation, melena or hematochezia.  No flank pain, dysuria, frequency or hematuria.  No polyuria, polydipsia, polyphagia or blurred vision.   ED course: Initial vital signs were temperature 98.6 F, pulse 93, respiration 18, BP 147/106 mmHg O2 sat 97% on room air.  The patient received 1000 mL of NS bolus and was started on dextrose 10% 50 mL/h.  I switch to D5 half NS with KCl 20 mEq and ordered 25 g of dextrose 50%.  Lab work: CBC 0, 7.5, hemoglobin 13.1 g/dL platelets 741.  Lipase was 22.  Magnesium was 2.0.  Sodium 138, potassium 2.7, chloride 100 and CO2 24 mmol/L.  Renal function, alkaline phosphatase total protein and albumin were normal.  Glucose was 69 and total bilirubin 1.6 mg/dL.  AST 74 and ALT at 102.   Review of Systems: As mentioned in the history of present illness. All other systems reviewed and are negative. Past Medical History:  Diagnosis Date   Avoidant-restrictive food intake disorder (ARFID)    Depression    OCD (obsessive compulsive disorder)    Past  Surgical History:  Procedure Laterality Date   AXILLARY LYMPH NODE BIOPSY     CHOLECYSTECTOMY     WISDOM TOOTH EXTRACTION     Social History:  reports that she has never smoked. She has never used smokeless tobacco. She reports that she does not drink alcohol and does not use drugs.  Allergies  Allergen Reactions   Pollen Extract Itching and Other (See Comments)    Runny nose, itchy eyes, stuffy nose   Tape Other (See Comments)    Coban wrap- Skin broke out once    Family History  Problem Relation Age of Onset   Hypercholesterolemia Mother    Hypertension Mother    Hypertension Father    Hypercholesterolemia Father    Hypertension Other    Hypercholesterolemia Other    Diabetes Mellitus II Other     Prior to Admission medications   Medication Sig Start Date End Date Taking? Authorizing Provider  ondansetron [facility]) 4 MG/5ML solution Take 5 mLs (4 mg total) by mouth every 6 (six) hours as needed for nausea or vomiting. 08/30/21  Yes Fayrene Helper, PA-C  ibuprofen (ADVIL,MOTRIN) 100 MG/5ML suspension Take 30 mLs (600 mg total) by mouth every 8 (eight) hours as needed for mild pain or moderate pain. Patient not taking: Reported on 09/02/2021 10/19/15   Trixie Dredge, PA-C  potassium chloride (KLOR-CON) 10 MEQ tablet Take 2 tablets (20 mEq total) by mouth daily for 5 days. Patient not taking: Reported on 09/02/2021 09/08/19 09/02/21  Farrel Gordon, PA-C  promethazine (PHENERGAN) 12.5 MG suppository Place 1 suppository (12.5 mg total) rectally every 6 (six) hours as needed for up to 10 doses for nausea or vomiting. Patient not taking: Reported on 09/02/2021 08/30/21   Fayrene Helper, PA-C    Physical Exam: Vitals:   09/02/21 1230 09/02/21 1349 09/02/21 1500 09/02/21 1715  BP: (!) 132/110 129/88 125/68 102/62  Pulse: (!) 108 79 93 98  Resp: 16 15 16 19   Temp:      TempSrc:      SpO2: 99% 99% 100% 100%  Weight:      Height:       Physical Exam Vitals and nursing note reviewed.   Constitutional:      Appearance: Normal appearance. She is obese.  HENT:     Head: Normocephalic.     Mouth/Throat:     Mouth: Mucous membranes are dry.     Pharynx: Posterior oropharyngeal erythema present. No oropharyngeal exudate.  Eyes:     General: No scleral icterus.    Pupils: Pupils are equal, round, and reactive to light.  Neck:     Vascular: No JVD.  Cardiovascular:     Rate and Rhythm: Normal rate and regular rhythm.     Heart sounds: Normal heart sounds, S1 normal and S2 normal.  Pulmonary:     Effort: Pulmonary effort is normal.     Breath sounds: Normal breath sounds.  Abdominal:     General: Bowel sounds are normal. There is no distension.     Palpations: Abdomen is soft.     Tenderness: There is no abdominal tenderness. There is no guarding.  Musculoskeletal:     Cervical back: Neck supple.     Right lower leg: No edema.     Left lower leg: No edema.  Skin:    General: Skin is warm and dry.  Neurological:     General: No focal deficit present.     Mental Status: She is alert and oriented to person, place, and time.  Psychiatric:        Mood and Affect: Mood normal.     Data Reviewed:  There are no new results to review at this time.  Assessment and Plan: Principal Problem:   Hypoglycemia Continue IVF plus dextrose infusion. CBG monitoring every 4 hours. Full liquid diet. Address pain/nausea/behavioral health.  Active Problems:   Hypokalemia Replacing.   Magnesium was supplemented.   Follow-up potassium level.    Abdominal pain/nausea without vomiting In the setting of:   Avoidant-restrictive food intake disorder (ARFID) Consulted behavioral health. Consult general surgery still having pain in the morning    Depressive disorder Behavioral health evaluation.    Advance Care Planning:   Code Status: Full Code   Consults: Behavioral health.  Family Communication: Her mother was in the ED room.  Severity of Illness: The appropriate  patient status for this patient is OBSERVATION. Observation status is judged to be reasonable and necessary in order to provide the required intensity of service to ensure the patient's safety. The patient's presenting symptoms, physical exam findings, and initial radiographic and laboratory data in the context of their medical condition is felt to place them at decreased risk for further clinical deterioration. Furthermore, it is anticipated that the patient will be medically stable for discharge from the hospital within 2 midnights of admission.   Author: , MD 09/02/2021 6:11 PM  For on call review [email or web address].   This document was prepared using [email or web address] and  may contain some unintended transcription errors.

## 2021-09-02 NOTE — Progress Notes (Signed)
Mother at bedside.

## 2021-09-02 NOTE — ED Notes (Addendum)
Pt holding cup for urine specimen,and missed. Pt will try again in about 1hr

## 2021-09-02 NOTE — ED Triage Notes (Addendum)
Patient states she has a eating disorder called Arfid. Patient states she gets nauseated when she eats food and then stops eating because she has nausea. Patient states she has not eaten in approx 4 weeks. Patient states she took a 3 days supply of Zofran and states she felt more nauseated. Patient also c/o abdominal discomfort.

## 2021-09-03 ENCOUNTER — Observation Stay (HOSPITAL_COMMUNITY): Payer: Medicaid Other

## 2021-09-03 ENCOUNTER — Inpatient Hospital Stay (HOSPITAL_COMMUNITY): Payer: Medicaid Other

## 2021-09-03 ENCOUNTER — Encounter (HOSPITAL_COMMUNITY): Payer: Self-pay | Admitting: Radiology

## 2021-09-03 DIAGNOSIS — R7989 Other specified abnormal findings of blood chemistry: Secondary | ICD-10-CM | POA: Diagnosis present

## 2021-09-03 DIAGNOSIS — Z833 Family history of diabetes mellitus: Secondary | ICD-10-CM | POA: Diagnosis not present

## 2021-09-03 DIAGNOSIS — E162 Hypoglycemia, unspecified: Secondary | ICD-10-CM | POA: Diagnosis present

## 2021-09-03 DIAGNOSIS — Z8249 Family history of ischemic heart disease and other diseases of the circulatory system: Secondary | ICD-10-CM | POA: Diagnosis not present

## 2021-09-03 DIAGNOSIS — E876 Hypokalemia: Secondary | ICD-10-CM

## 2021-09-03 DIAGNOSIS — R8281 Pyuria: Secondary | ICD-10-CM | POA: Diagnosis present

## 2021-09-03 DIAGNOSIS — F418 Other specified anxiety disorders: Secondary | ICD-10-CM | POA: Diagnosis not present

## 2021-09-03 DIAGNOSIS — Z6841 Body Mass Index (BMI) 40.0 and over, adult: Secondary | ICD-10-CM | POA: Diagnosis not present

## 2021-09-03 DIAGNOSIS — F410 Panic disorder [episodic paroxysmal anxiety] without agoraphobia: Secondary | ICD-10-CM | POA: Diagnosis present

## 2021-09-03 DIAGNOSIS — F32A Depression, unspecified: Secondary | ICD-10-CM

## 2021-09-03 DIAGNOSIS — R110 Nausea: Secondary | ICD-10-CM

## 2021-09-03 DIAGNOSIS — F429 Obsessive-compulsive disorder, unspecified: Secondary | ICD-10-CM | POA: Diagnosis present

## 2021-09-03 DIAGNOSIS — F5082 Avoidant/restrictive food intake disorder: Secondary | ICD-10-CM | POA: Diagnosis present

## 2021-09-03 DIAGNOSIS — R112 Nausea with vomiting, unspecified: Secondary | ICD-10-CM | POA: Diagnosis present

## 2021-09-03 DIAGNOSIS — Z560 Unemployment, unspecified: Secondary | ICD-10-CM | POA: Diagnosis not present

## 2021-09-03 DIAGNOSIS — R739 Hyperglycemia, unspecified: Secondary | ICD-10-CM | POA: Diagnosis present

## 2021-09-03 DIAGNOSIS — R202 Paresthesia of skin: Secondary | ICD-10-CM

## 2021-09-03 DIAGNOSIS — Z8349 Family history of other endocrine, nutritional and metabolic diseases: Secondary | ICD-10-CM | POA: Diagnosis not present

## 2021-09-03 DIAGNOSIS — Z9049 Acquired absence of other specified parts of digestive tract: Secondary | ICD-10-CM | POA: Diagnosis not present

## 2021-09-03 DIAGNOSIS — R200 Anesthesia of skin: Secondary | ICD-10-CM

## 2021-09-03 DIAGNOSIS — R1013 Epigastric pain: Secondary | ICD-10-CM | POA: Diagnosis not present

## 2021-09-03 DIAGNOSIS — R319 Hematuria, unspecified: Secondary | ICD-10-CM | POA: Diagnosis present

## 2021-09-03 LAB — CBC
HCT: 36.5 % (ref 36.0–46.0)
Hemoglobin: 11.9 g/dL — ABNORMAL LOW (ref 12.0–15.0)
MCH: 25.9 pg — ABNORMAL LOW (ref 26.0–34.0)
MCHC: 32.6 g/dL (ref 30.0–36.0)
MCV: 79.3 fL — ABNORMAL LOW (ref 80.0–100.0)
Platelets: 387 10*3/uL (ref 150–400)
RBC: 4.6 MIL/uL (ref 3.87–5.11)
RDW: 16.3 % — ABNORMAL HIGH (ref 11.5–15.5)
WBC: 6.2 10*3/uL (ref 4.0–10.5)
nRBC: 0 % (ref 0.0–0.2)

## 2021-09-03 LAB — COMPREHENSIVE METABOLIC PANEL
ALT: 76 U/L — ABNORMAL HIGH (ref 0–44)
AST: 57 U/L — ABNORMAL HIGH (ref 15–41)
Albumin: 2.7 g/dL — ABNORMAL LOW (ref 3.5–5.0)
Alkaline Phosphatase: 63 U/L (ref 38–126)
Anion gap: 9 (ref 5–15)
BUN: <5 mg/dL — ABNORMAL LOW (ref 6–20)
CO2: 22 mmol/L (ref 22–32)
Calcium: 8.1 mg/dL — ABNORMAL LOW (ref 8.9–10.3)
Chloride: 110 mmol/L (ref 98–111)
Creatinine, Ser: 0.79 mg/dL (ref 0.44–1.00)
GFR, Estimated: >60 mL/min (ref >60)
Glucose, Bld: 115 mg/dL — ABNORMAL HIGH (ref 70–99)
Potassium: 3.4 mmol/L — ABNORMAL LOW (ref 3.5–5.1)
Sodium: 141 mmol/L (ref 135–145)
Total Bilirubin: 1.1 mg/dL (ref 0.3–1.2)
Total Protein: 6.1 g/dL — ABNORMAL LOW (ref 6.5–8.1)

## 2021-09-03 LAB — GLUCOSE, CAPILLARY
Glucose-Capillary: 102 mg/dL — ABNORMAL HIGH (ref 70–99)
Glucose-Capillary: 103 mg/dL — ABNORMAL HIGH (ref 70–99)
Glucose-Capillary: 105 mg/dL — ABNORMAL HIGH (ref 70–99)
Glucose-Capillary: 131 mg/dL — ABNORMAL HIGH (ref 70–99)
Glucose-Capillary: 91 mg/dL (ref 70–99)
Glucose-Capillary: 94 mg/dL (ref 70–99)

## 2021-09-03 LAB — MAGNESIUM: Magnesium: 1.9 mg/dL (ref 1.7–2.4)

## 2021-09-03 IMAGING — MR MR HEAD W/O CM
10 series · 48 of 48 positions shown · non-contrast
Comparison: No prior MRI, correlation is made with CT head
[DATE]

CLINICAL DATA: Stroke suspected

EXAM:
MRI HEAD WITHOUT CONTRAST
TECHNIQUE: Multiplanar, multiecho pulse sequences of the brain and surrounding
structures were obtained without intravenous contrast.

[Series 9: T1 · sagittal · 5.0mm · 0.75mm/px · 3 of 24 slices shown (1 of 2)]
[im 1/24]
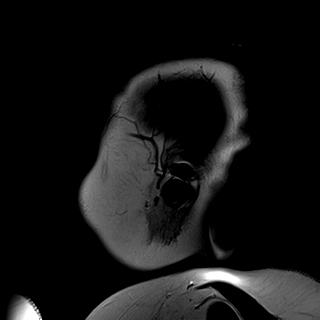
[im 12/24]
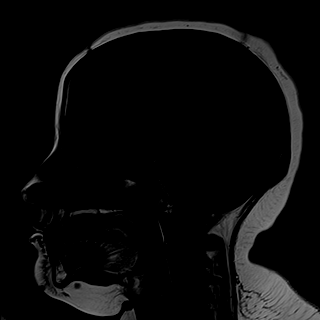
[im 24/24]
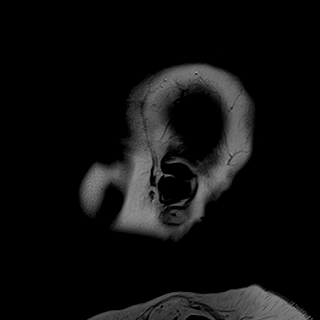

[Series 10: cor dwi_tracew · coronal · 5.0mm · 1.53mm/px · 5 of 56 slices shown]
[im 1/56]
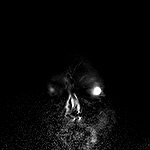
[im 14/56]
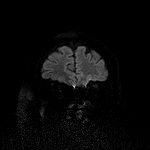
[im 28/56]
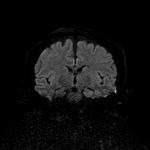
[im 42/56]
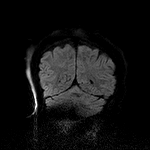
[im 56/56]
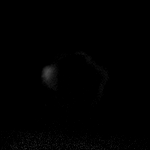

[Series 11: cor dwi_adc · coronal · 5.0mm · 1.53mm/px · 2 of 28 slices shown]
[im 1/28]
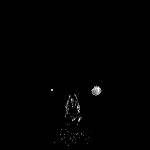
[im 28/28]
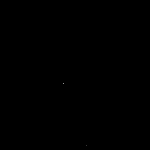

[Series 12: T2 · coronal · 5.0mm · 0.86mm/px · 2 of 28 slices shown (1 of 2)]
[im 1/28]
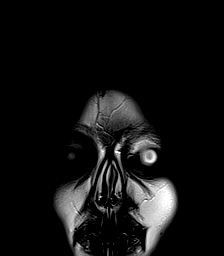
[im 28/28]
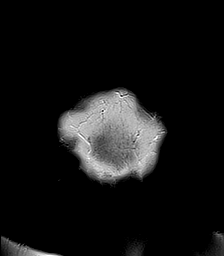

[Series 21: DWI · axial · 3.0mm · 1.36mm/px · z∈[-42,+98]mm · 8 of 96 slices shown (1 of 2)]
[im 1/96]
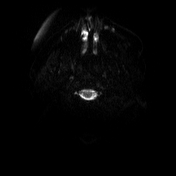
[im 14/96]
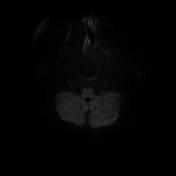
[im 28/96]
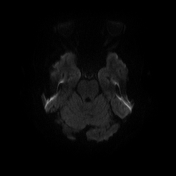
[im 41/96]
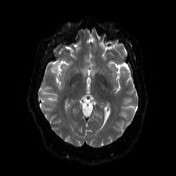
[im 55/96]
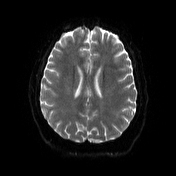
[im 68/96]
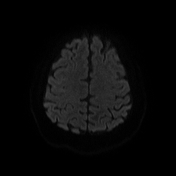
[im 82/96]
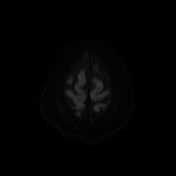
[im 96/96]
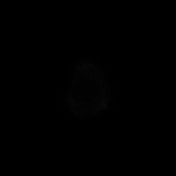

[Series 22: DWI · axial · 3.0mm · 1.36mm/px · z∈[-42,+98]mm · 4 of 48 slices shown (2 of 2)]
[im 1/48]
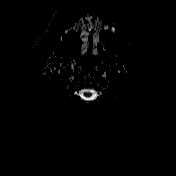
[im 16/48]
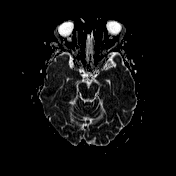
[im 32/48]
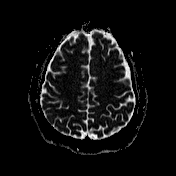
[im 48/48]
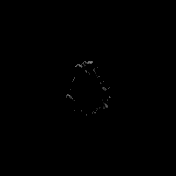

[Series 23: T2 · axial · 5.0mm · 0.62mm/px · z∈[-39,+97]mm · 2 of 22 slices shown (2 of 2)]
[im 1/22]
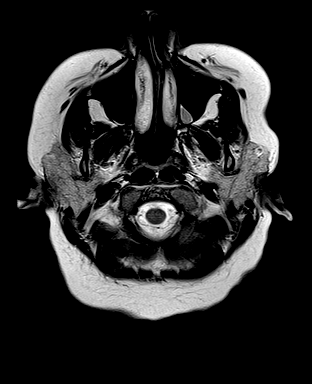
[im 22/22]
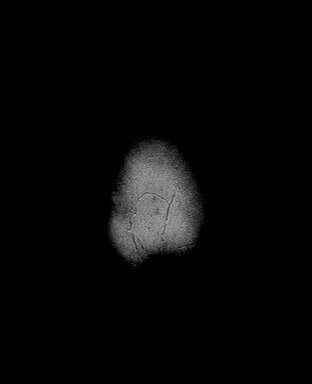

[Series 24: swi_images · axial · 3.0mm · 0.75mm/px · z∈[-53,+112]mm · 5 of 56 slices shown]
[im 1/56]
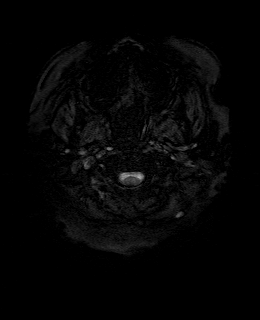
[im 14/56]
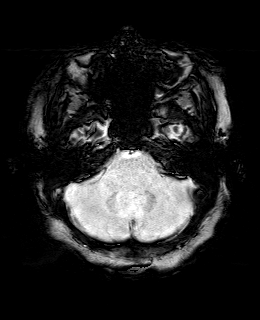
[im 28/56]
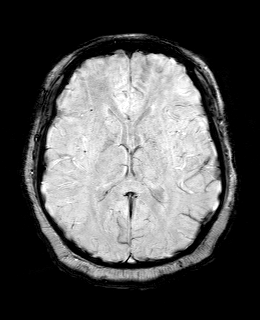
[im 42/56]
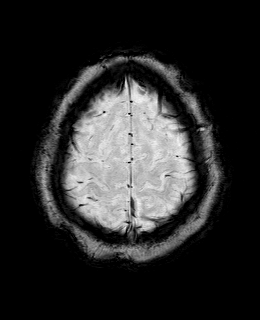
[im 56/56]
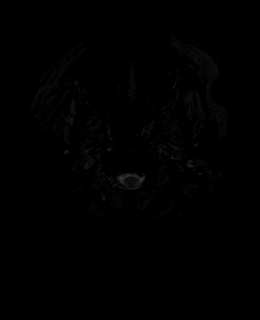

[Series 26: FLAIR · axial · 3.0mm · 0.75mm/px · z∈[-41,+100]mm · 4 of 48 slices shown]
[im 1/48]
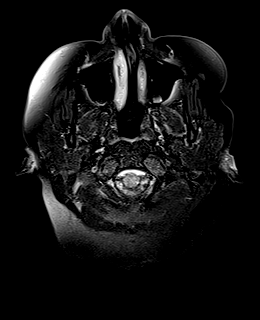
[im 16/48]
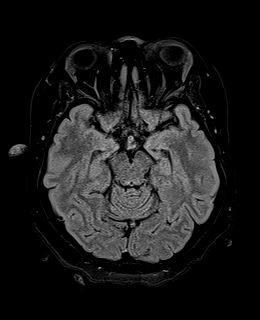
[im 32/48]
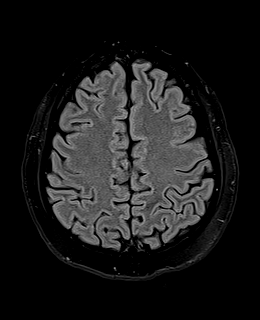
[im 48/48]
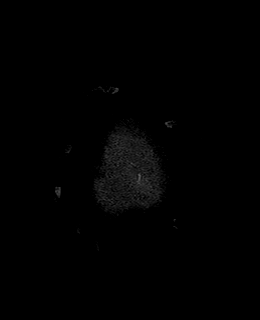

[Series 27: T1 · axial · 1.0mm · 0.94mm/px · z∈[-42,+101]mm · 13 of 144 slices shown (2 of 2)]
[im 1/144]
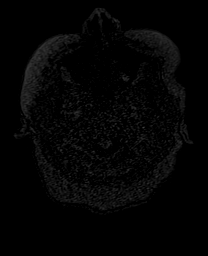
[im 12/144]
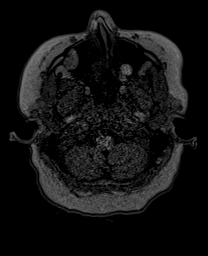
[im 24/144]
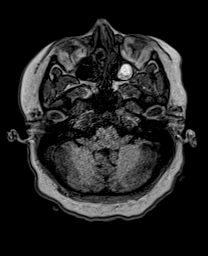
[im 36/144]
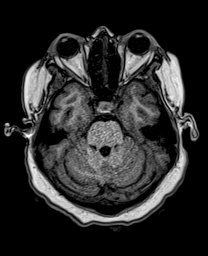
[im 48/144]
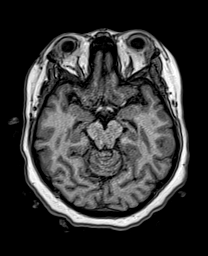
[im 60/144]
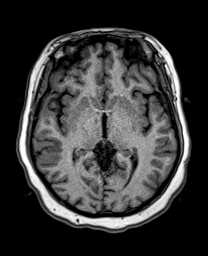
[im 72/144]
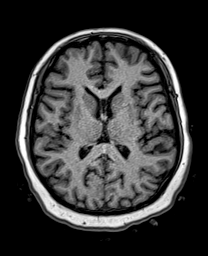
[im 84/144]
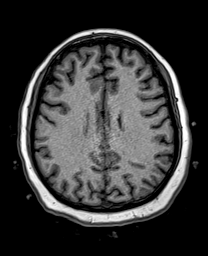
[im 96/144]
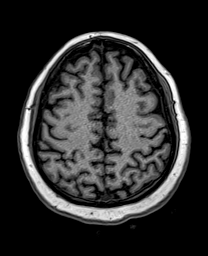
[im 108/144]
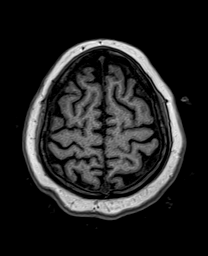
[im 120/144]
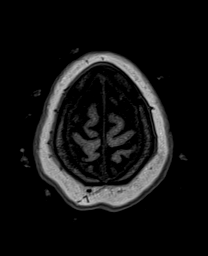
[im 132/144]
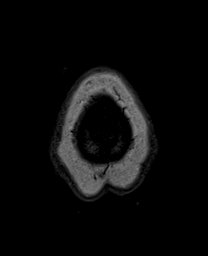
[im 144/144]
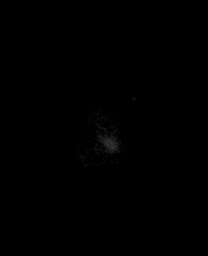

[48 of 48 positions shown; findings below may reference images not displayed]

FINDINGS: Brain: No restricted diffusion to suggest acute or subacute infarct.
No acute hemorrhage, mass, mass effect, or midline shift. No
hemosiderin deposition to suggest remote hemorrhage. No
hydrocephalus or extra-axial collection. Pituitary within normal
limits. Normal craniocervical junction.

Vascular: Normal arterial flow voids.

Skull and upper cervical spine: Normal marrow signal.

Sinuses/Orbits: Mucous retention cysts in the bilateral maxillary
sinuses. The orbits are unremarkable.

Other: The mastoids are well aerated.
IMPRESSION: No acute intracranial process. No evidence of acute or subacute
infarct.

## 2021-09-03 IMAGING — MR MR CERVICAL SPINE W/O CM
6 series · 36 of 48 positions shown · non-contrast
Comparison: None Available.

CLINICAL DATA: Acute myelopathy in the cervical spine.

EXAM:
MRI CERVICAL SPINE WITHOUT CONTRAST
TECHNIQUE: Multiplanar, multisequence MR imaging of the cervical spine was
performed. No intravenous contrast was administered.

[Series 5: T1 · sagittal · 3.0mm · 0.69mm/px · 6 of 14 slices shown (1 of 2)]
[im 1/14]
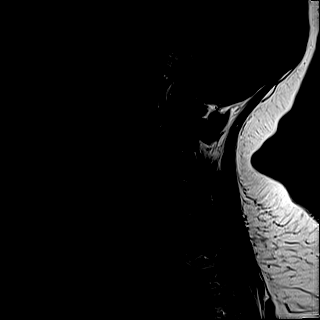
[im 3/14]
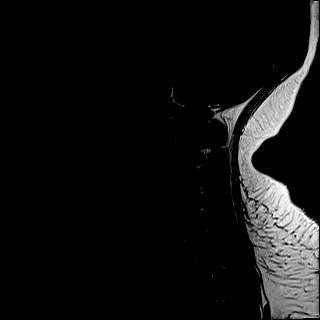
[im 6/14]
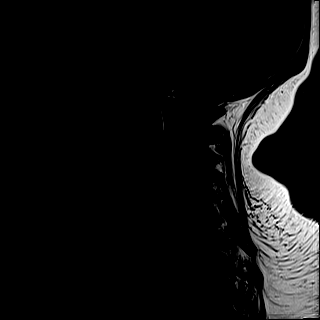
[im 8/14]
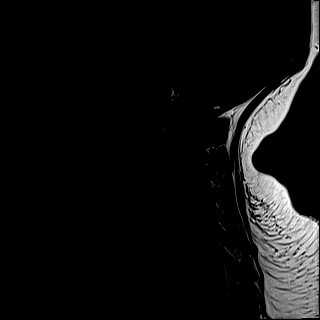
[im 11/14]
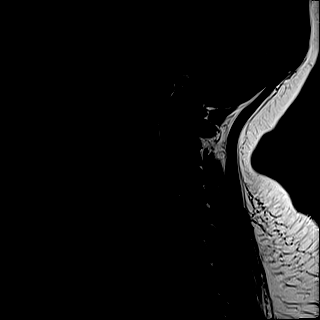
[im 14/14]
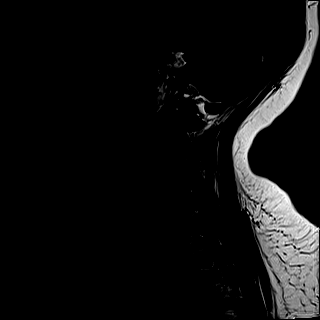

[Series 6: T2 · sagittal · 3.0mm · 0.69mm/px · 6 of 14 slices shown (1 of 2)]
[im 1/14]
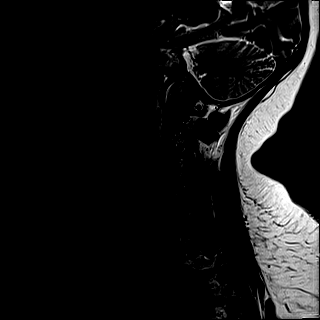
[im 3/14]
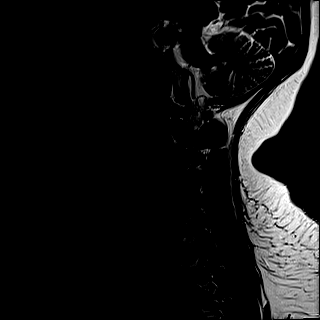
[im 6/14]
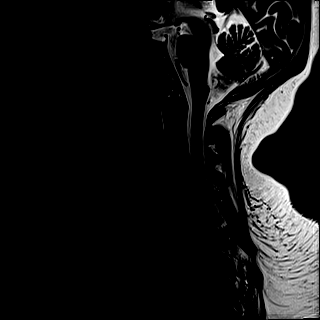
[im 8/14]
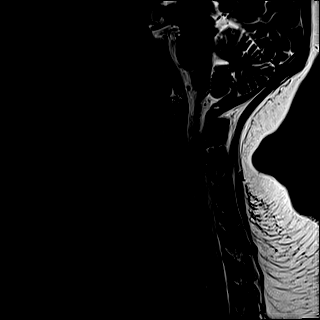
[im 11/14]
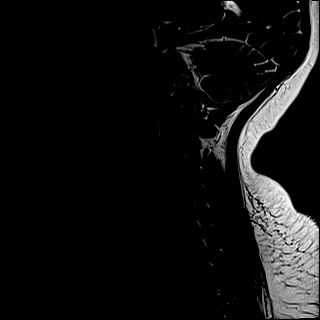
[im 14/14]
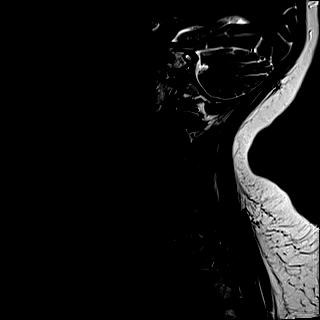

[Series 7: STIR · sagittal · 3.0mm · 0.86mm/px · 6 of 14 slices shown]
[im 1/14]
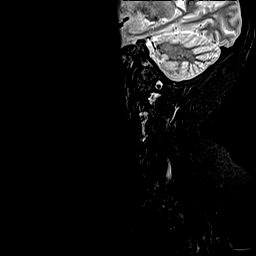
[im 3/14]
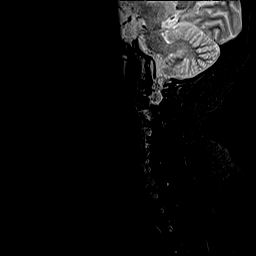
[im 6/14]
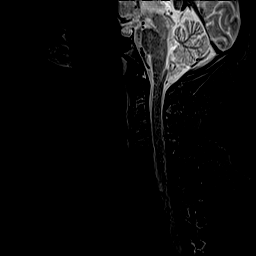
[im 8/14]
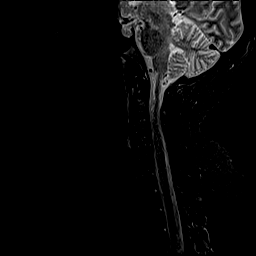
[im 11/14]
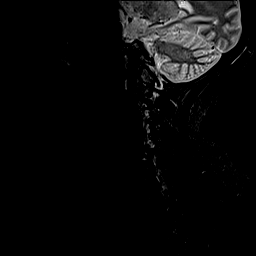
[im 14/14]
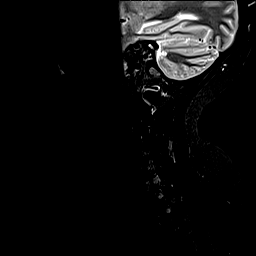

[Series 8: T2 · axial · 3.0mm · 0.70mm/px · z∈[-179,-94]mm · 8 of 26 slices shown (2 of 2)]
[im 1/26]
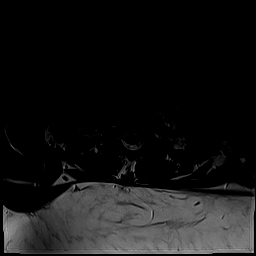
[im 3/26]
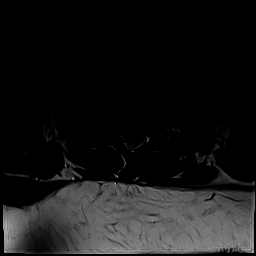
[im 9/26]
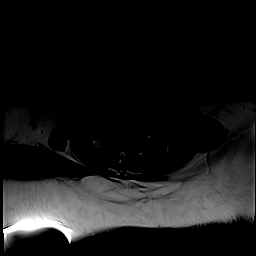
[im 12/26]
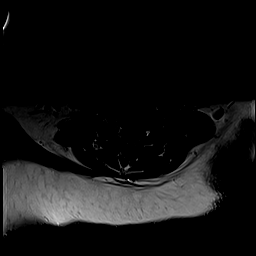
[im 14/26]
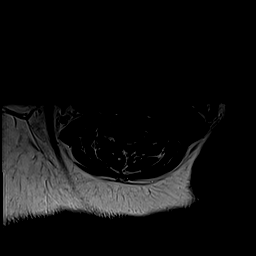
[im 17/26]
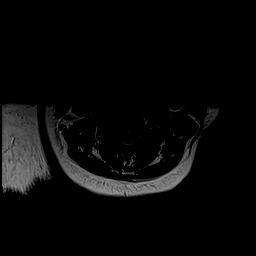
[im 23/26]
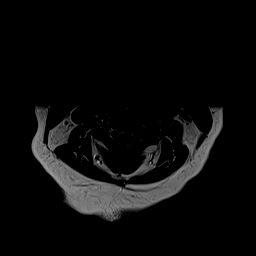
[im 26/26]
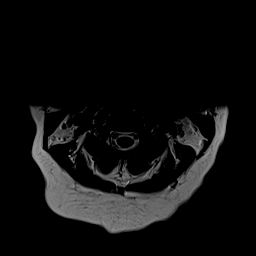

[Series 9: GRE · axial · 3.0mm · 0.35mm/px · z∈[-179,-173]mm · 2 of 26 slices shown]
[im 1/26]
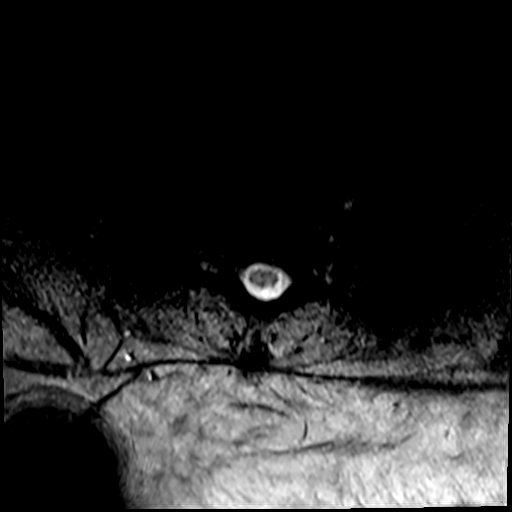
[im 3/26]
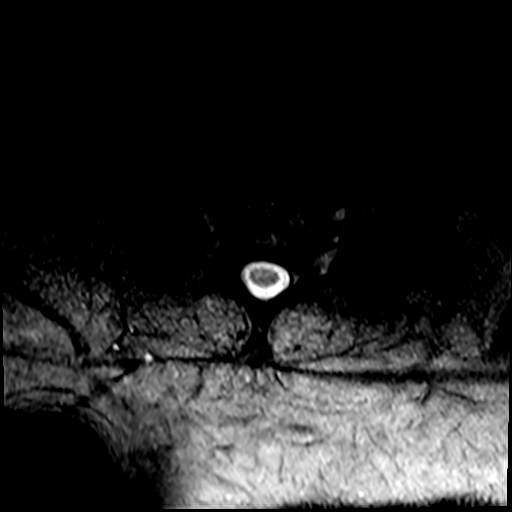

[Series 10: T1 · axial · 3.0mm · 0.35mm/px · z∈[-179,-94]mm · 8 of 26 slices shown (2 of 2)]
[im 1/26]
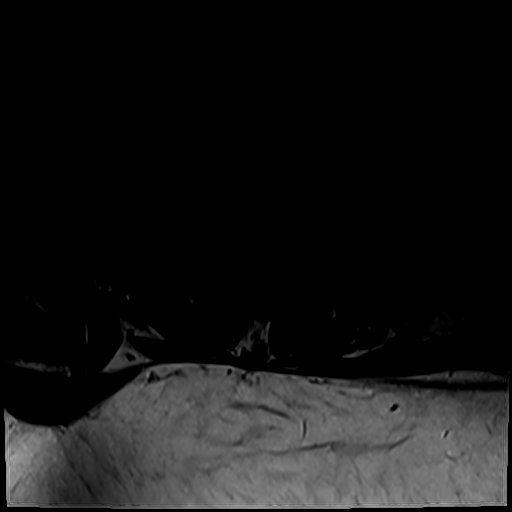
[im 3/26]
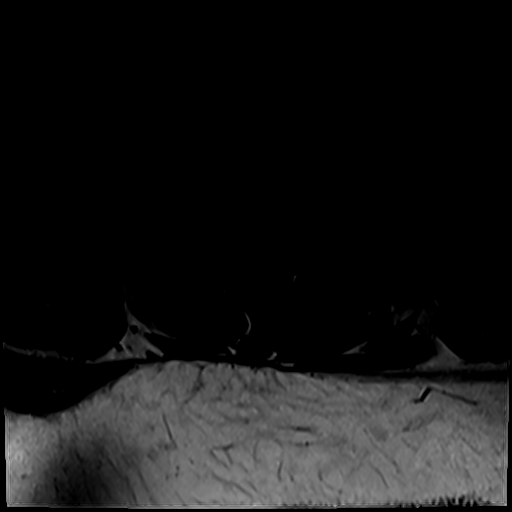
[im 9/26]
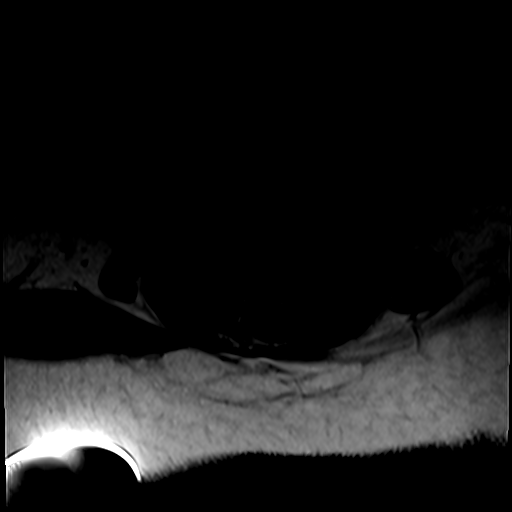
[im 12/26]
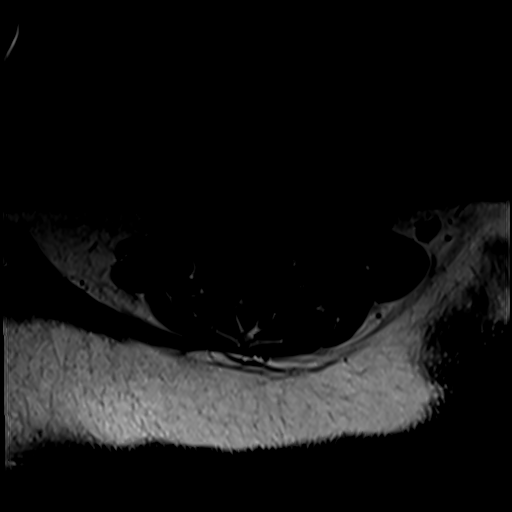
[im 14/26]
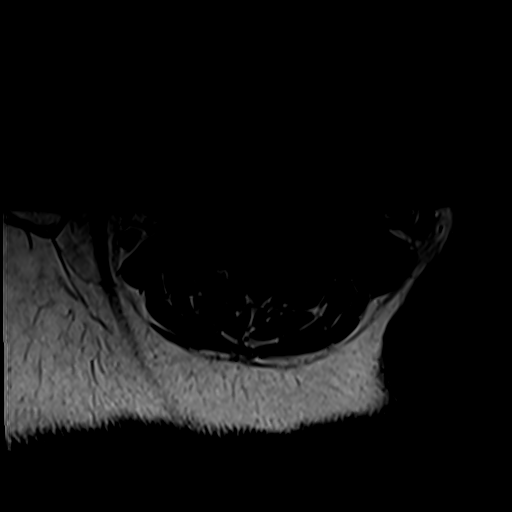
[im 17/26]
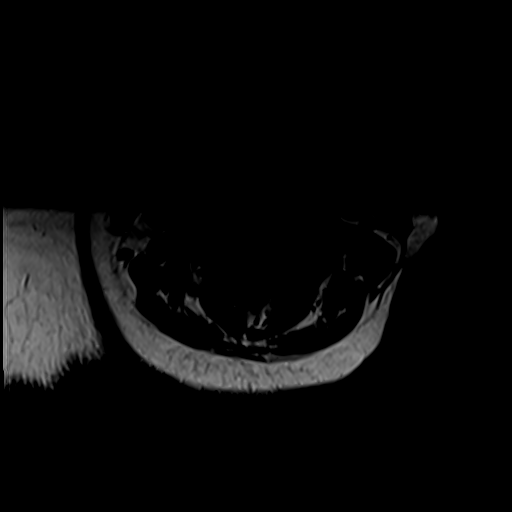
[im 23/26]
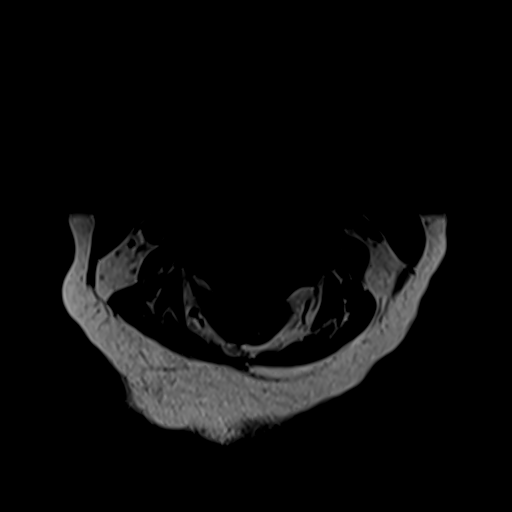
[im 26/26]
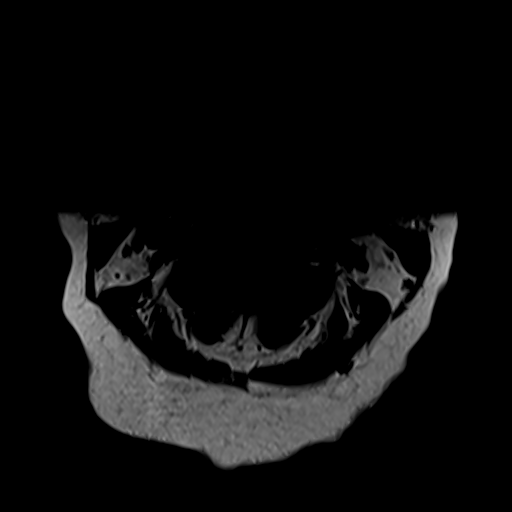

[36 of 48 positions shown; findings below may reference images not displayed]

FINDINGS: Alignment: Straightening of the normal cervical lordosis. No
listhesis.

Vertebrae: No acute fracture or suspicious osseous lesion.

Cord: Normal signal and morphology.

Posterior Fossa, vertebral arteries, paraspinal tissues: Negative.

Disc levels:

C2-C3: No significant disc bulge. No spinal canal stenosis or
neuroforaminal narrowing.

C3-C4: No significant disc bulge. No spinal canal stenosis or
neuroforaminal narrowing.

C4-C5: Minimal disc bulge. No spinal canal stenosis or neural
foraminal narrowing.

C5-C6: No significant disc bulge. No spinal canal stenosis or
neuroforaminal narrowing.

C6-C7: No significant disc bulge. No spinal canal stenosis or
neuroforaminal narrowing.

C7-T1: No significant disc bulge. No spinal canal stenosis or
neuroforaminal narrowing.
IMPRESSION: No spinal canal stenosis or neural foraminal narrowing.

## 2021-09-03 IMAGING — US US ABDOMEN LIMITED
1 series · 14 of 18 positions shown · non-contrast
Comparison: [DATE]

CLINICAL DATA: Abnormal LFTs

EXAM:
ULTRASOUND ABDOMEN LIMITED RIGHT UPPER QUADRANT

[Series 1: us abdomen limited ruq (liver/gb) · 14 of 18 slices shown]
[im 1/18]
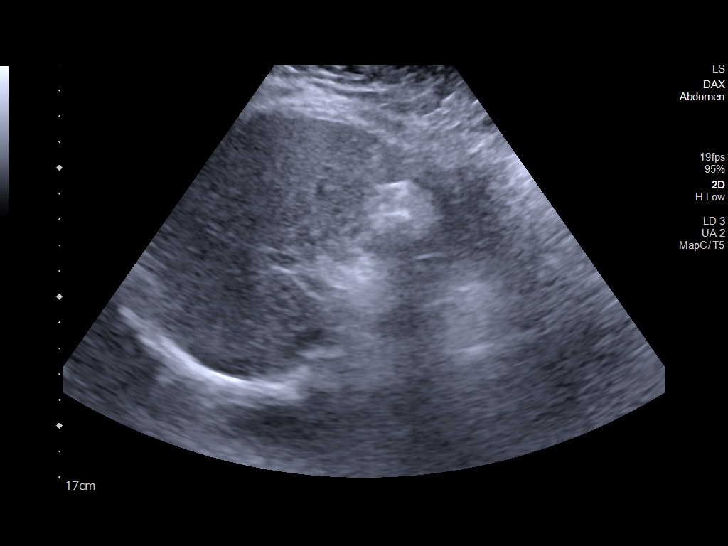
[im 2/18]
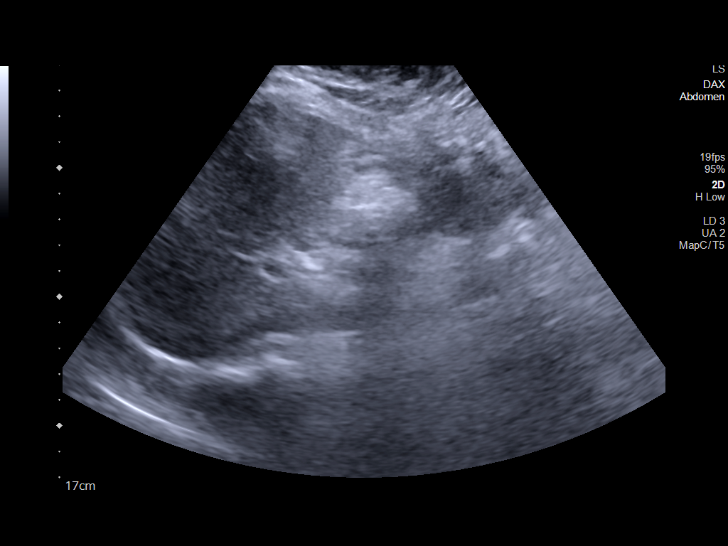
[im 4/18]
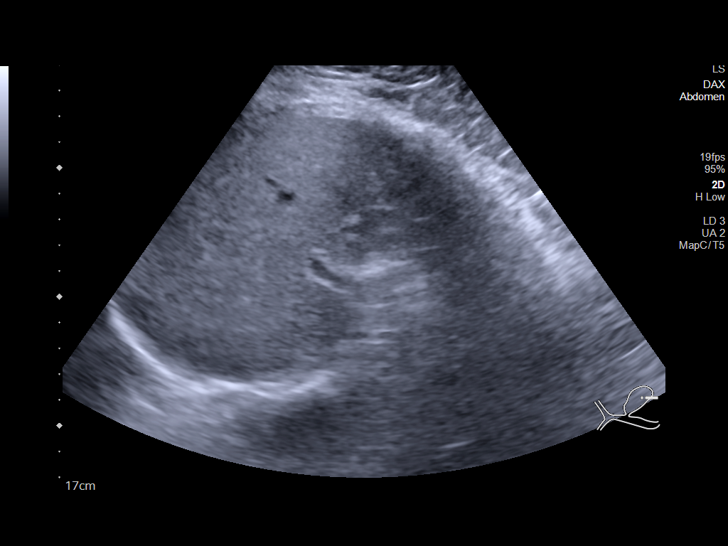
[im 5/18]
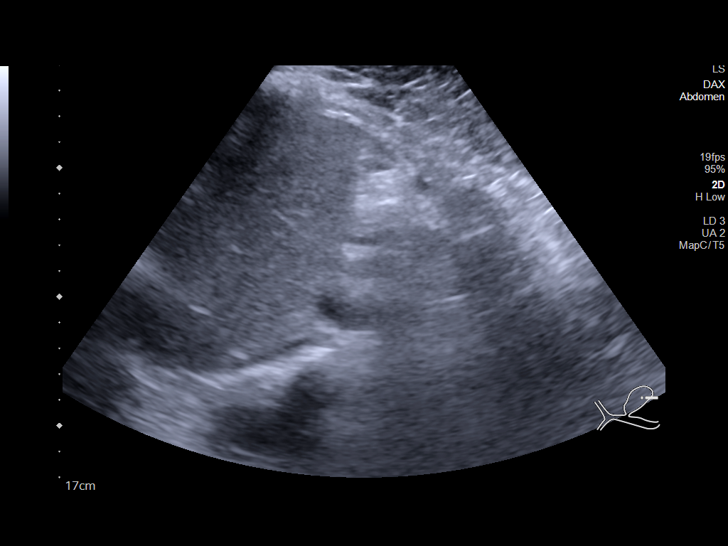
[im 6/18]
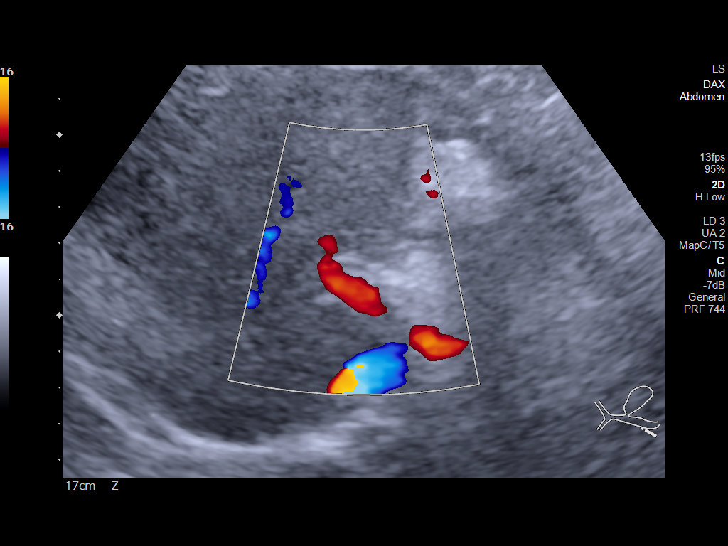
[im 8/18]
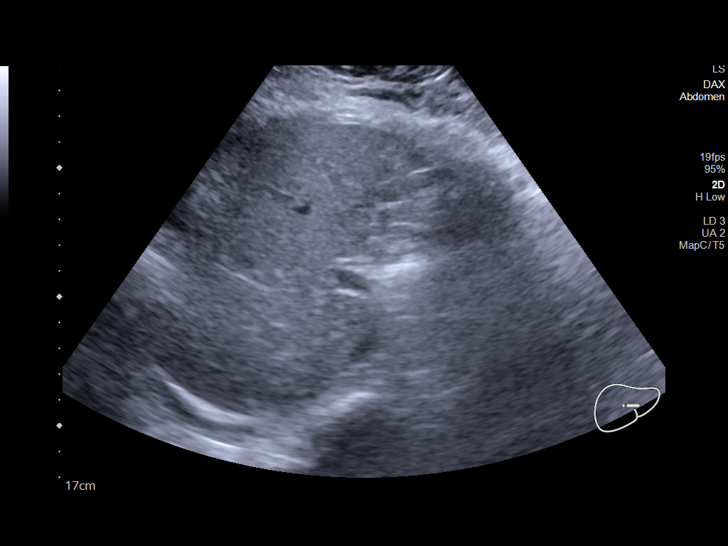
[im 9/18]
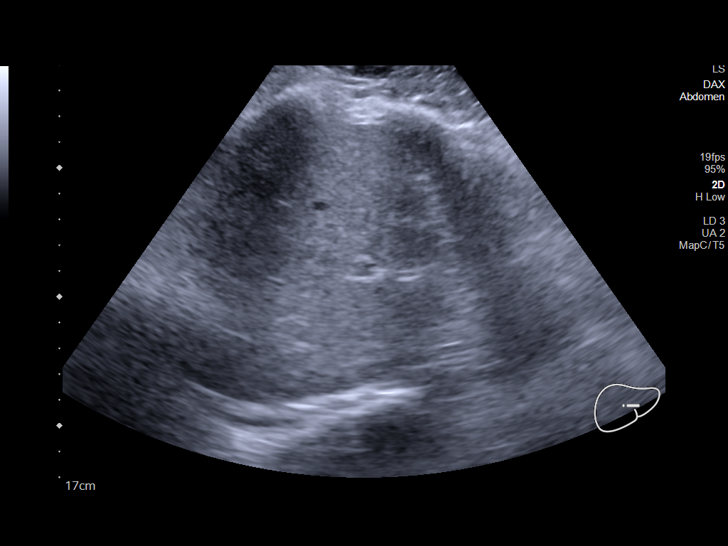
[im 10/18]
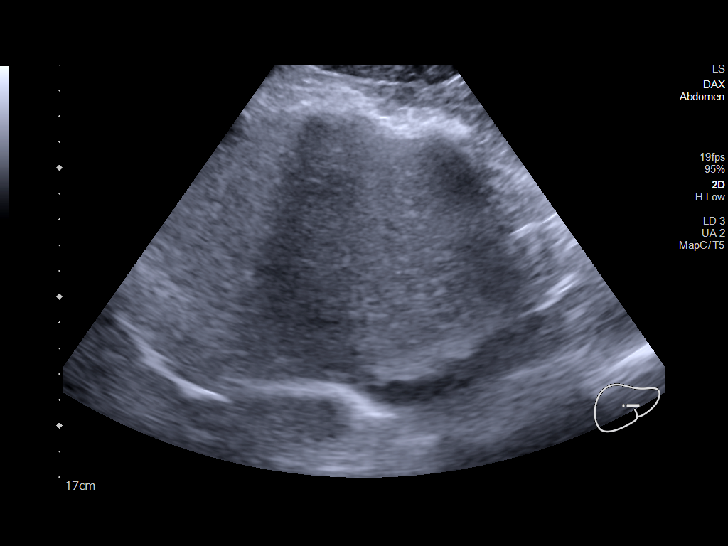
[im 11/18]
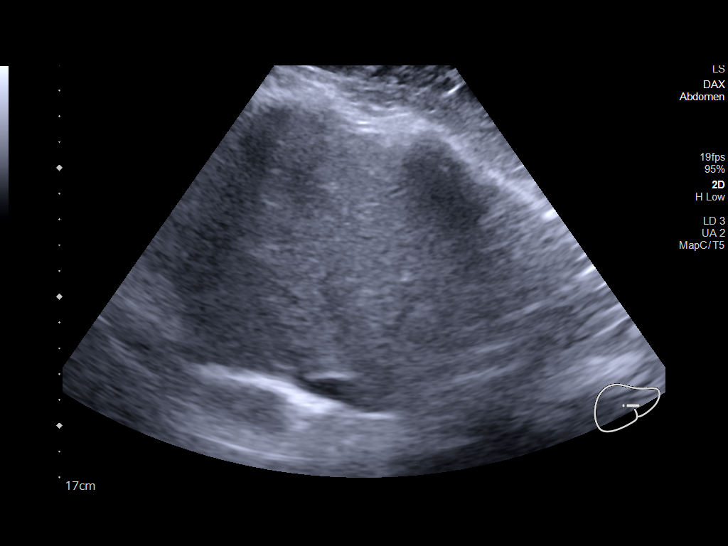
[im 13/18]
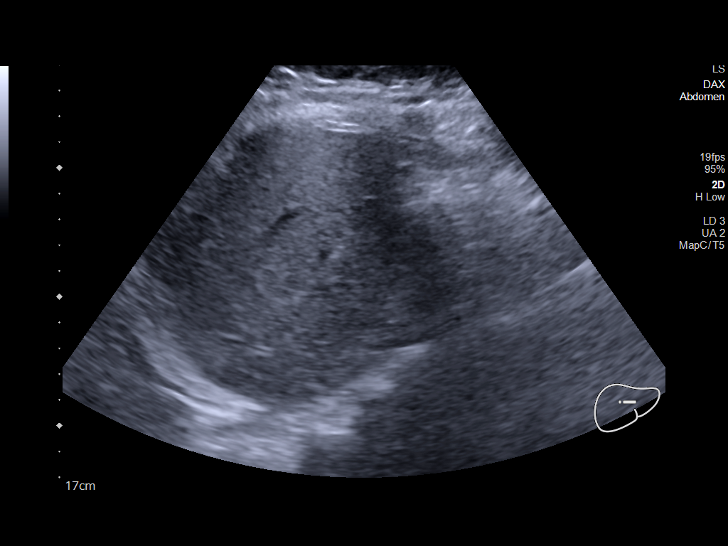
[im 14/18]
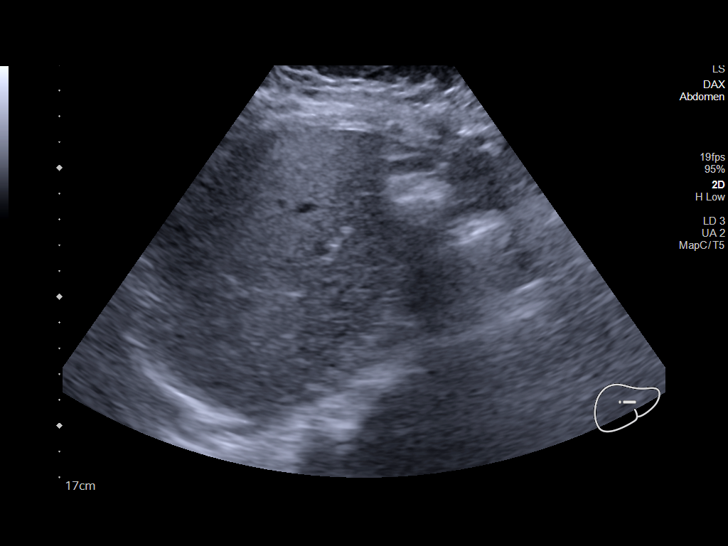
[im 15/18]
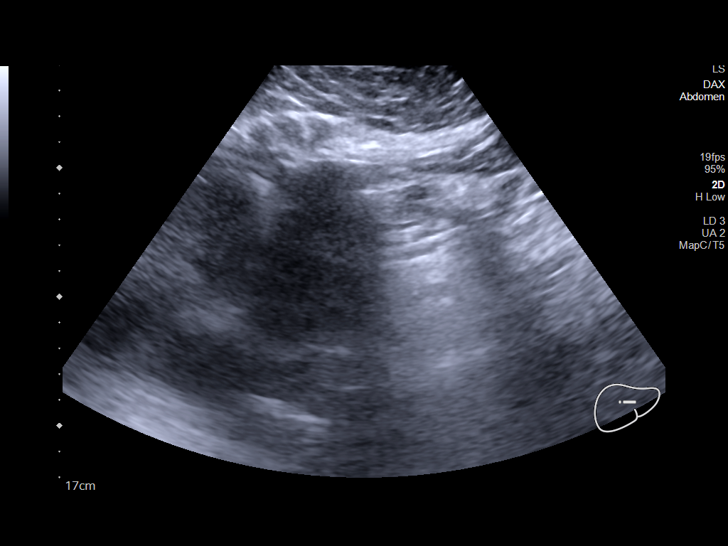
[im 17/18]
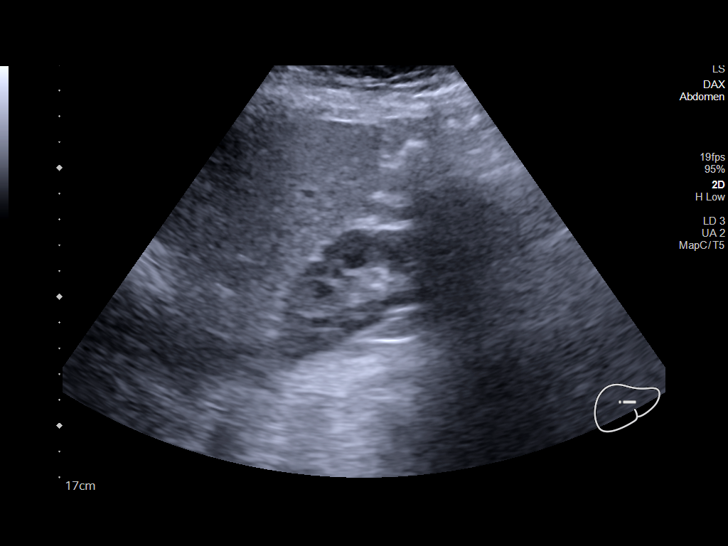
[im 18/18]
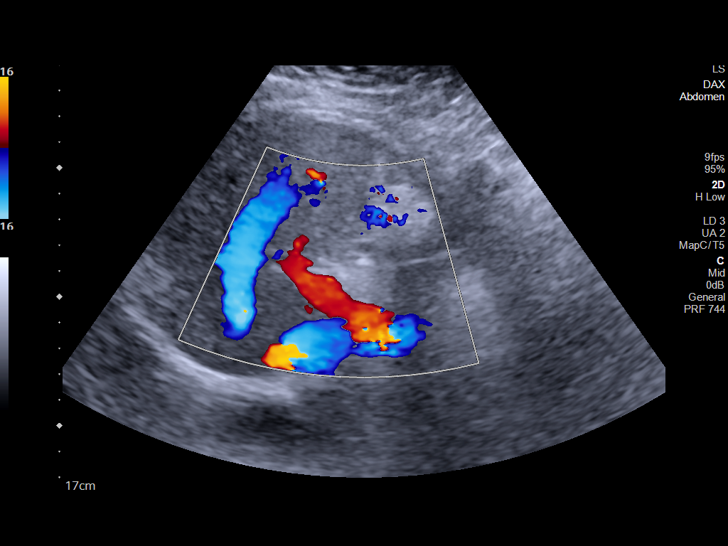

[14 of 18 positions shown; findings below may reference images not displayed]

FINDINGS: Gallbladder:

Status post cholecystectomy. No sonographic Murphy sign noted by
sonographer.

Common bile duct:

Diameter: 0.3 cm

Liver:

No focal lesion identified. Within normal limits in parenchymal
echogenicity. Portal vein is patent on color Doppler imaging with
normal direction of blood flow towards the liver.

Other: None.
IMPRESSION: 1. No ultrasound abnormality of the right upper quadrant.
2. Status post cholecystectomy.

## 2021-09-03 IMAGING — CT CT ABD-PELV W/ CM
4 series · 13 of 46 positions shown, 18 images · IV contrast (APPLIED)
Comparison: None Available.

CLINICAL DATA: Nausea/vomiting

EXAM:
CT ABDOMEN AND PELVIS WITH CONTRAST
TECHNIQUE: Multidetector CT imaging of the abdomen and pelvis was performed
using the standard protocol following bolus administration of
intravenous contrast.

[Series 2: axial st · axial · 0.98mm/px · z∈[-687,-297]mm · 7 of 105 slices shown, 12 images]
[im 14/105  soft-tissue]
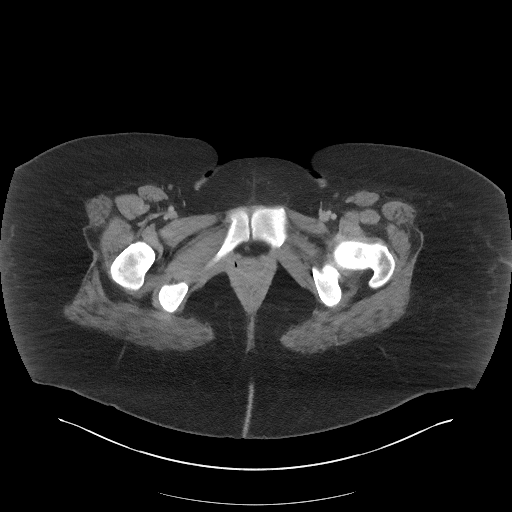
[im 14/105  bone]
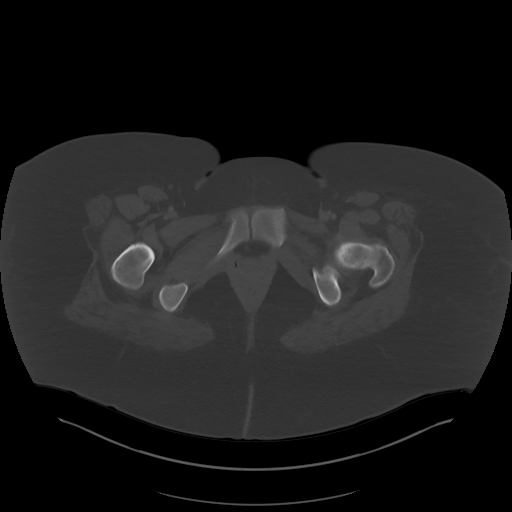
[im 27/105  soft-tissue]
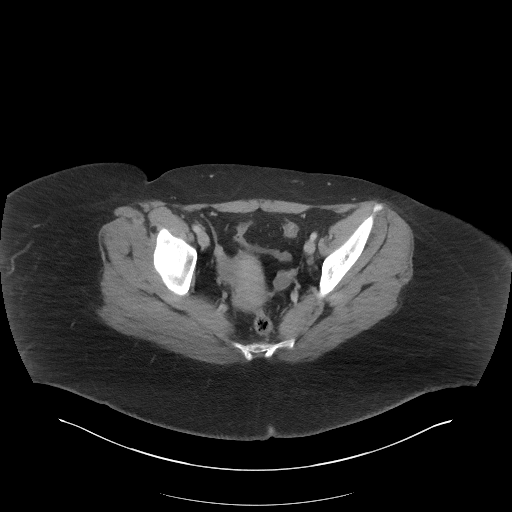
[im 40/105  soft-tissue]
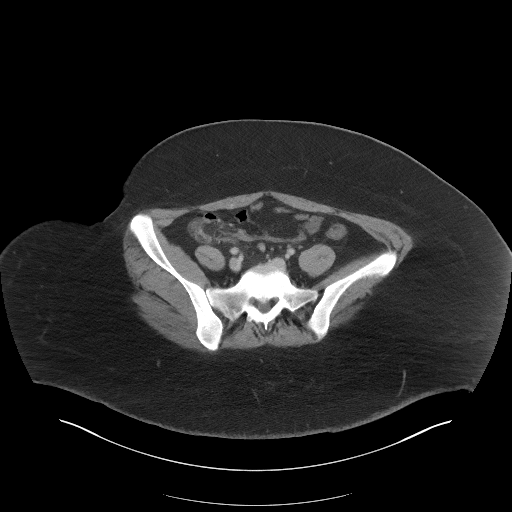
[im 53/105  soft-tissue]
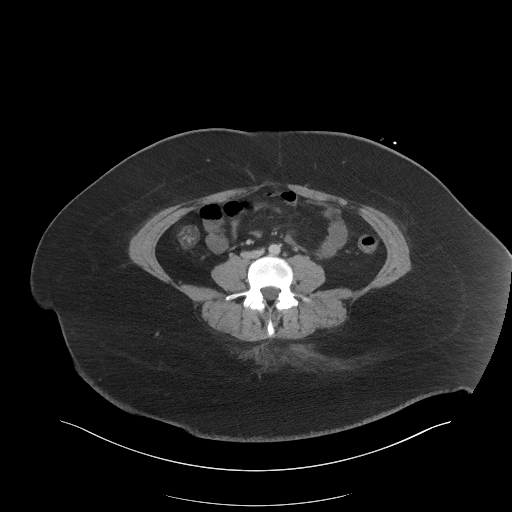
[im 53/105  lung]
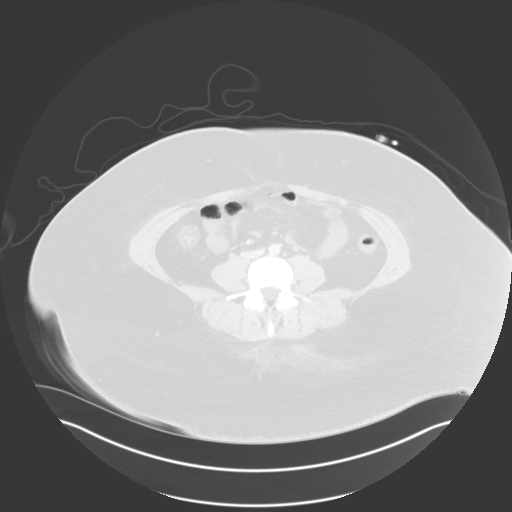
[im 66/105  soft-tissue]
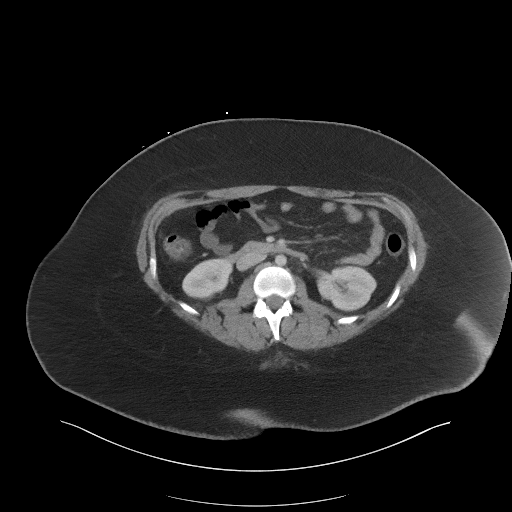
[im 66/105  lung]
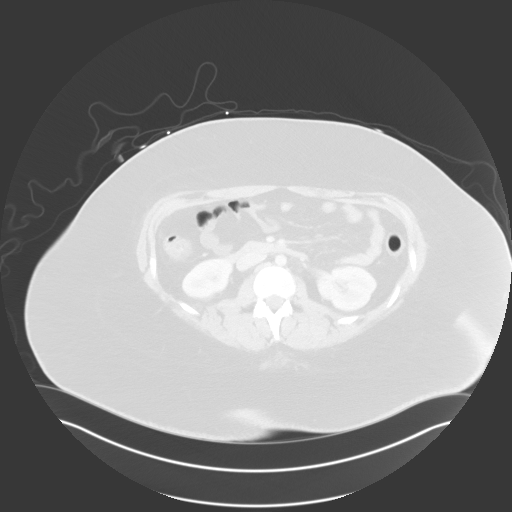
[im 79/105  soft-tissue]
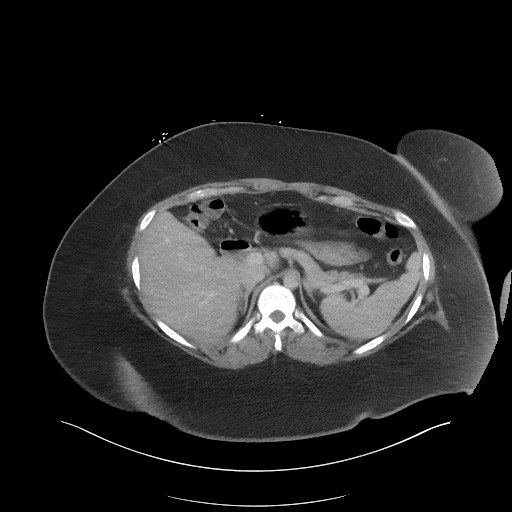
[im 79/105  lung]
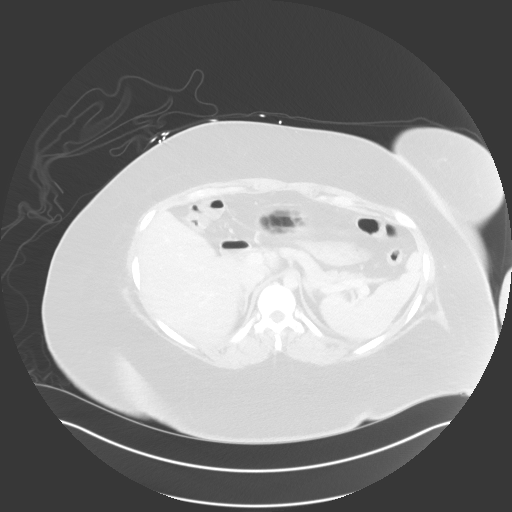
[im 92/105  soft-tissue]
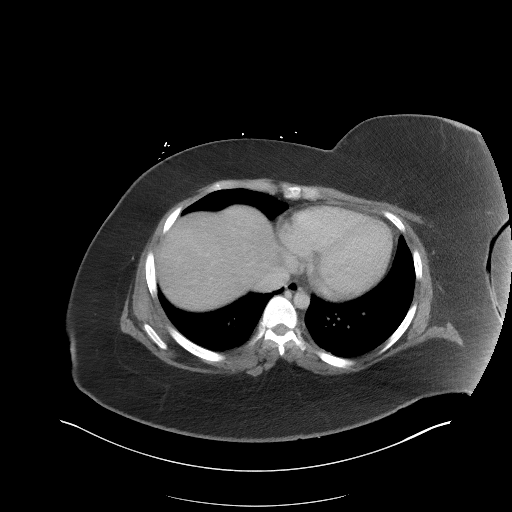
[im 92/105  lung]
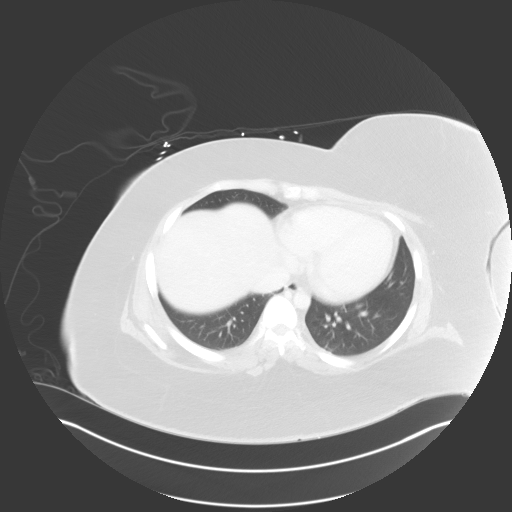

[Series 4: coronal st · coronal · 1.01mm/px · 3 of 108 slices shown]
[im 36/108  soft-tissue]
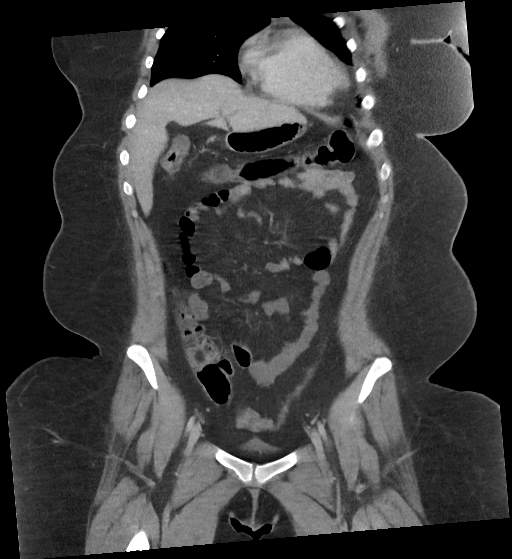
[im 48/108  soft-tissue]
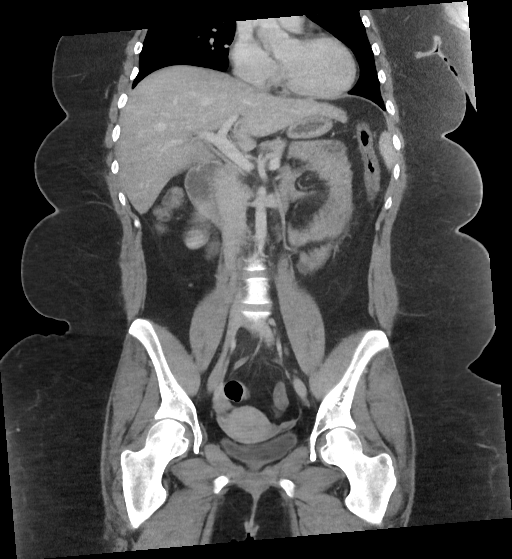
[im 60/108  soft-tissue]
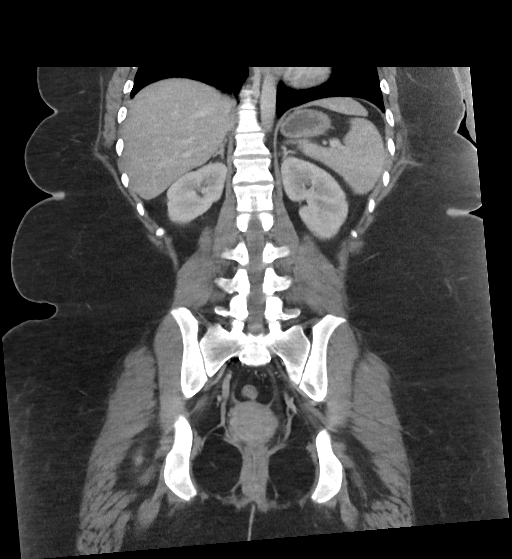

[Series 5: sagittal st · sagittal · 0.63mm/px · 1 of 172 slices shown]
[im 67/172  soft-tissue]
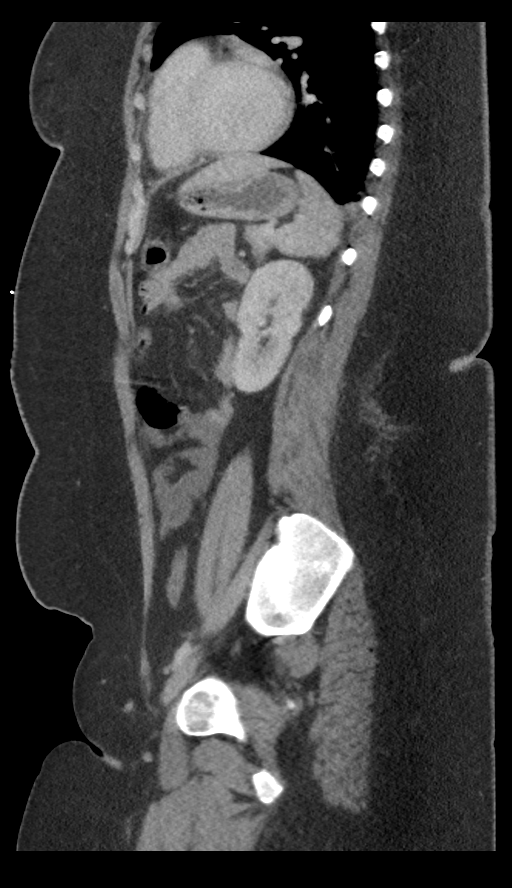

[Series 6: lung bases · axial · 0.98mm/px · z∈[-708,-660]mm · 2 of 262 slices shown]
[im 24/262  bone]
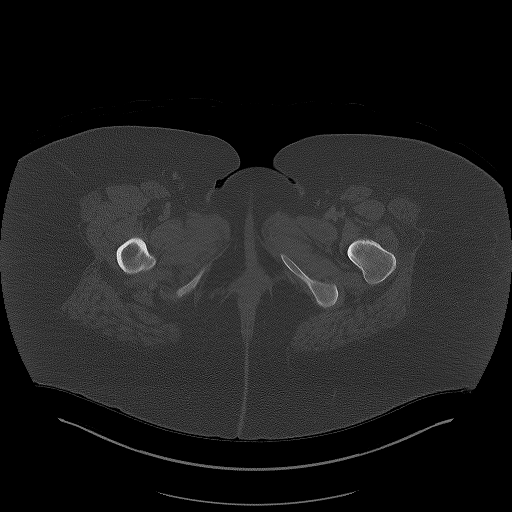
[im 48/262  bone]
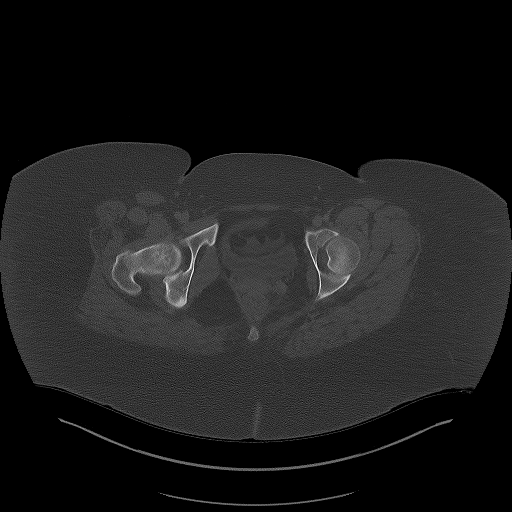

[13 of 46 positions shown; findings below may reference images not displayed]

RADIATION DOSE REDUCTION: This exam was performed according to the
departmental dose-optimization program which includes automated
exposure control, adjustment of the mA and/or kV according to
patient size and/or use of iterative reconstruction technique.

CONTRAST:  100mL OMNIPAQUE IOHEXOL 300 MG/ML  SOLN
FINDINGS: Lower chest: No acute abnormality.

Hepatobiliary: No focal liver abnormality is seen. The gallbladder
is unremarkable.

Pancreas: Unremarkable. No pancreatic ductal dilatation or
surrounding inflammatory changes.

Spleen: Normal in size without focal abnormality.

Adrenals/Urinary Tract: Adrenal glands are unremarkable. No
hydronephrosis or nephrolithiasis. The bladder is minimally
distended.

Stomach/Bowel: The stomach is within normal limits. There is no
evidence of bowel obstruction.The appendix is normal.

Vascular/Lymphatic: No significant vascular findings are present. No
enlarged abdominal or pelvic lymph nodes.

Reproductive: Unremarkable.

Other: No abdominal wall hernia or abnormality. No abdominopelvic
ascites.

Musculoskeletal: No acute or significant osseous findings.
IMPRESSION: No acute abdominopelvic abnormality. No bowel obstruction. Normal
appendix.

## 2021-09-03 MED ORDER — IOHEXOL 300 MG/ML  SOLN
100.0000 mL | Freq: Once | INTRAMUSCULAR | Status: AC | PRN
  Administered 2021-09-03: 100 mL via INTRAVENOUS

## 2021-09-03 MED ORDER — ONDANSETRON HCL 4 MG/2ML IJ SOLN
4.0000 mg | Freq: Four times a day (QID) | INTRAMUSCULAR | Status: DC | PRN
  Administered 2021-09-03: 4 mg via INTRAVENOUS
  Filled 2021-09-03: qty 2

## 2021-09-03 MED ORDER — IOHEXOL 9 MG/ML PO SOLN
ORAL | Status: AC
  Filled 2021-09-03: qty 1000

## 2021-09-03 MED ORDER — POTASSIUM CHLORIDE 10 MEQ/100ML IV SOLN
10.0000 meq | INTRAVENOUS | Status: AC
  Administered 2021-09-03 (×4): 10 meq via INTRAVENOUS
  Filled 2021-09-03 (×4): qty 100

## 2021-09-03 MED ORDER — SODIUM CHLORIDE (PF) 0.9 % IJ SOLN
INTRAMUSCULAR | Status: AC
  Filled 2021-09-03: qty 50

## 2021-09-03 MED ORDER — SCOPOLAMINE 1 MG/3DAYS TD PT72
1.0000 | MEDICATED_PATCH | TRANSDERMAL | Status: DC
Start: 2021-09-03 — End: 2021-09-06
  Administered 2021-09-03: 1.5 mg via TRANSDERMAL
  Filled 2021-09-03 (×2): qty 1

## 2021-09-03 NOTE — TOC Initial Note (Signed)
Transition of Care [facility]) - Initial/Assessment Note    Patient Details  Name: Kristen Pacheco MRN: [id number] Date of Birth: Jun 10, 2000  [facility] CM/SW Contact:    Golda Acre, RN Phone Number: 09/03/2021, 9:11 AM  Clinical Narrative:                 Will need pcp will get her an appointment [facility].  Eating disorder-see behavorial health presently.  Expected Discharge Plan: Home/Self Care Barriers to Discharge: Continued Medical Work up   Patient Goals and CMS Choice Patient states their goals for this hospitalization and ongoing recovery are:: to go home CMS Medicare.gov Compare Post Acute Care list provided to:: Patient    Expected Discharge Plan and Services Expected Discharge Plan: Home/Self Care   Discharge Planning Services: CM Consult   Living arrangements for the past 2 months: Apartment                                      Prior Living Arrangements/Services Living arrangements for the past 2 months: Apartment Lives with:: Self Patient language and need for interpreter reviewed:: Yes Do you feel safe going back to the place where you live?: Yes      Need for Family Participation in Patient Care: Yes (Comment) (mother for support due to eating disorder)     Criminal Activity/Legal Involvement Pertinent to Current Situation/Hospitalization: No - Comment as needed  Activities of Daily Living Home Assistive Devices/Equipment: None ADL Screening (condition at time of admission) Patient's cognitive ability adequate to safely complete daily activities?: Yes Is the patient deaf or have difficulty hearing?: No Does the patient have difficulty seeing, even when wearing glasses/contacts?: No Does the patient have difficulty concentrating, remembering, or making decisions?: No Patient able to express need for assistance with ADLs?: Yes Does the patient have difficulty dressing or bathing?: No Independently  performs ADLs?: Yes (appropriate for developmental age) Does the patient have difficulty walking or climbing stairs?: No Weakness of Legs: None Weakness of Arms/Hands: None  Permission Sought/Granted                  Emotional Assessment Appearance:: Appears stated age Attitude/Demeanor/Rapport: Engaged Affect (typically observed): Depressed Orientation: : Oriented to Self, Oriented to Place, Oriented to  Time, Oriented to Situation Alcohol / Substance Use: Not Applicable Psych Involvement: No (comment)  Admission diagnosis:  Hypokalemia [E87.6] Hypoglycemia [E16.2] Avoidant-restrictive food intake disorder (ARFID) [F50.82] Patient Active Problem List   Diagnosis Date Noted   Hypoglycemia 09/02/2021   Hypokalemia 09/02/2021   Abdominal pain 09/02/2021   Nausea without vomiting 09/02/2021   Avoidant-restrictive food intake disorder (ARFID) 10/13/2019   Depressive disorder 03/06/2017   Adjustment disorder with mixed anxiety and depressed mood 10/16/2015   Intrinsic eczema 10/11/2015   Seasonal allergic rhinitis due to pollen 08/05/2015   PCP:  Pcp, No Pharmacy:   [id number] - [place], [place] - [address] AT [facility] [address] [place] [address] Phone: [phone] Fax: [phone]     Social Determinants of Health (SDOH) Interventions    Readmission Risk Interventions     No data to display

## 2021-09-03 NOTE — Consult Note (Signed)
Kristen Pacheco is a  21 year old female with medical history significant for depression, OCD, and recently diagnosed with restrictive food intake disorder.  Psychiatry was consulted for eating disorder.  Patient is seen and briefly assessed.  Patient reports having multiple psychiatric conditions to include MDD, OCD, avoidant food restrictive intake disorder, in which she has been receiving therapy for.  She has tried multiple psychotropic medications most recently to include 80 mg of fluoxetine and Abilify 10 mg, and propanolol.  She reports neither of these medications working for her, so she discontinued them last year.  She states currently she is receiving outpatient therapy through a private practice, [facility].  She denies having a psychiatrist at this time, citing previous medications ineffective.  Patient states she has been admitted to [facility] eating disorder clinic, 2-week length of stay.  However she reports this length of stay was also ineffective, citing treatment there did not help."  They tell me they do not know how to best manage my condition in 2021.  Patient denies any acute psychiatric symptoms, that would warrant inpatient psychiatric services.  She does endorse recent weight loss of 25 pounds in a 6-month..  Chart review shows within the past 2 ear patient's weight has fluctuated up and down to include weight 189 in August 2021, 200 pounds in June 2021, and most recently 276.  Patient is reporting weight loss of 25 pounds in a 73-month.  Does not appear to be consistent with chart review which shows patient has had less than a 5 pound weight loss during the month of June.  There was a brief admission to [facility], however no weight was obtained during that visit.  On evaluation patient is alert and oriented, calm and cooperative, very pleasant upon approach.  She is noted to be wearing a bonnet.  She is currently unemployed, actively seeking work and  resides at home.  She states her unemployment, inability to live independently have resulted in her increased stressors which triggered her most recent eating disorder.   Patient denies any access to weapons, denies any alcohol and or substance abuse.  She reports moderate sleep and poor appetite.    She endorses recent history of avoidant food restricting intake diet secondary to her nausea.  She denies any binge -eating, purging, or ever exercising. She further denies any inappropriate use of laxatives to aid in weight loss. SHe denies any body dysmorphia, or physical unattractiveness at this weight.   She also is receiving services private practice, in which she reports compliance with most of her appointments.  Patient denies any auditory and/or visual hallucinations, does not appear to be responding to internal or external stimuli.  There is no evidence of delusional thought content and patient appears to answer all questions appropriately.  At this time patient appears to be stable to discharge home, with support system services in place.    Psychiatry will sign off at this time.  Patient will not benefit from any additional inpatient psychiatric consult services at this time.  Please see list below for outpatient therapy recommendations.    Please consider referral to any of these.  Patient cites in [employer] being a major barrier for her.  Please keep this in mind when referring to these facilities.  Patient's current therapist does not specialize in eating disorder.  Eating Disorders Therapists   Mike Craze  [address] [place], [place] [address] [phone]  [facility]  (  [phone]   [facility]  [address] [place], [place] [address] [phone]  [facility] [address] [place], [place] [address] [phone]  [facility]  [address] [place], [place] [address] [phone]  [facility]  [address] [place], [place] [address] [phone]  [facility] [address] [place], [place] [address] [phone]  [facility]** [address] [place], [place] [address] [phone]  [address] [place], [place] [address] [phone]  **Takes Medicaid

## 2021-09-03 NOTE — Hospital Course (Signed)
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  The patient has been having persistent nausea which subsequently brought up eating disorders.  She has not been seen by surgery but she has been following behavioral health and admits to having a sore throat but no rhinorrhea, dyspnea, wheezing or hemoptysis.  She states that she has had persistent nausea for 4 weeks and avoids food due to the nausea.  In the ED she received a 1 L normal saline bolus and started on D10 at 50 MLS per hour and will switch to D5 half-normal saline +20 mEq of KCl given concern for hypoglycemia.  LFTs were slightly abnormal and T. bili was elevated.  Her hyperglycemia is improved but she continues to have significant nausea without vomiting and does have some occasional abdominal pain.  Psychiatry evaluated and feels that she is stable and can be discharged from their perspective but given her persistent nausea we will do further work-up with a CT scan of the abdomen pelvis as well as a right upper quadrant ultrasound.  Because the right upper quadrant ultrasound and the CT scan of the abdomen pelvis are negative we have consulted GI for further evaluation of her nausea.   GI evaluated and recommending scheduling antiemetics and starting MiraLAX 17 g p.o. twice daily for bowel regimen as well as PPI twice daily and sucralfate 1 g p.o. ACHS.  If there is no no improvement with medical therapy then GI will consider endoscopy.

## 2021-09-03 NOTE — Progress Notes (Signed)
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  The patient has been having persistent nausea which subsequently brought up eating disorders.  She has not been seen by surgery but she has been following behavioral health and admits to having a sore throat but no rhinorrhea, dyspnea, wheezing or hemoptysis.  She states that she has had persistent nausea for 4 weeks and avoids food due to the nausea.  In the ED she received a 1 L normal saline bolus and started on D10 at 50 MLS per hour and will switch to D5 half-normal saline +20 mEq of KCl given concern for hypoglycemia.  LFTs were slightly abnormal and T. bili was elevated.  Her hyperglycemia is improved but she continues to have significant nausea without vomiting and does have some occasional abdominal pain.  Psychiatry evaluated and feels that she is stable and can be discharged from their perspective but given her persistent nausea we will do further work-up with a CT scan of the abdomen pelvis as well as a right upper quadrant ultrasound.  Because the right upper quadrant ultrasound and the CT scan of the abdomen pelvis are negative we have consulted GI for further evaluation of her nausea.    Assessment and Plan: No notes have been filed under this hospital service. Service: Hospitalist   Persistent Nausea without vomiting and recent abdominal pain Avoidant-Restrictive Food Intake Disorder -Likely in the setting of avoidant restrictive food disorder however need to rule out any underlying medical issues -Psychiatry was consulted and they felt that her  unemployment and inability to live by herself have triggered stressors with her most recent eating disorder and they feel that she is stable to be discharged home with support system in place but given that she continues have nausea we have ordered a further work-up -CT scan of the abdomen pelvis done and showed "No acute abdominopelvic abnormality. No bowel obstruction. Normal appendix." -Right upper quadrant ultrasound showed "No ultrasound abnormality of the right upper quadrant. Status post cholecystectomy." -We will continue on a full liquid diet given her nausea -She is on D5 half-normal saline +20 mEq of KCl at 125 mL/h  -Given persistent nausea we will give added to scopolamine patch, Compazine 10 mg per 6 PR for nausea vomiting and will also add Zofran 4 mg every 6hprn  -We will check hemoglobin A1c to evaluate for diabetes but she could have a component of gastroparesis -GIs been consulted for further evaluation and recommendations -Nutritionist has also been consulted for further evaluation given her poor p.o. intake  Hypokalemia -Mild at 3.4 -Replete with D5 half-normal saline +20 mEq at 125 MLS per hour plus IV KCl 40 mEq x 1; she received IV 30 mEq yesterday -Check mag level in the a.m. -Continue to monitor and replete as necessary -Repeat CMP in a.m.  Hypoglycemia -Mild and Imprvoed -On IVF as above -Check HbA1c given Obesity -C/w CBG Monitoring -On Full Liquid Diet   Depression/OCD -Psych Consulted   Bilateral Arm Numbness and Tingling -Right > Left with tingling in her finger tips -Check MRI of Brain and Cervical Spine -Check HbA1c to evaluate  -May  need to discuss with neurology -Likely will need outpatient EMG and nerve conduction studies  Morbid Obesity -Complicates overall prognosis and care -Estimated body mass index is 49.8 kg/m as calculated from the following:   Height as of this encounter: 5' 2.5" (1.588 m).   Weight as of this encounter: 125.5 kg.   -Weight Loss and Dietary Counseling given   DVT prophylaxis: enoxaparin (LOVENOX) injection 40 mg Start: 09/02/21 1530    Code Status: Full Code Family Communication: Discussed with Mother at bedside   Disposition Plan:  Level of care: Progressive Status is: Observation The patient will require care spanning > 2 midnights and should be moved to inpatient because: Will need further GI evaluation and recommendations   Consultants:  Gastroenterology Psychiatry   Procedures:  CT Scan Abd/Pelvis RUQ U/S   Antimicrobials:  Anti-infectives (From admission, onward)    None        Subjective: Seen and examined at bedside and she had a slightly flat affect and states that she has been nauseous for about 4 weeks.  Denies any chest pain or lightheadedness.  States her abdominal pain is improved.  Think she is doing all right though.  States that she slept fairly okay.  No other concerns or complaints this time  Objective: Vitals:   09/02/21 1939 09/02/21 2124 09/03/21 0005 09/03/21 0342  BP:  119/82 111/80 (!) 113/55  Pulse:  93 88 87  Resp:  18 16 18   Temp:  98.1 F (36.7 C) 98 F (36.7 C) 98.3 F (36.8 C)  TempSrc:  Oral Oral Oral  SpO2:  96% 100% 100%  Weight: 125.5 kg     Height: 5' 2.5" (1.588 m)       Intake/Output Summary (Last 24 hours) at 09/03/2021 1401 Last data filed at 09/02/2021 2000 Gross per 24 hour  Intake 437.5 ml  Output --  Net 437.5 ml   Filed Weights   09/02/21 0955 09/02/21 1939  Weight: 121.6 kg 125.5 kg   Examination: Physical Exam:  Constitutional: WN/WD morbidly obese African-American female currently in no acute distress but has a flat affect Respiratory: Slightly diminished to auscultation bilaterally, no wheezing, rales, rhonchi or crackles. Normal respiratory effort and patient is not tachypenic. No accessory muscle use.  Cardiovascular: RRR, no murmurs / rubs / gallops. S1 and S2 auscultated. No extremity edema.   Abdomen: Soft,  non-tender, distended secondary body habitus. Bowel sounds positive.  GU: Deferred. Musculoskeletal: No clubbing / cyanosis of digits/nails. No joint deformity upper and lower extremities.  Skin: No rashes, lesions, ulcers on limited skin evaluation. No induration; Warm and dry.  Neurologic: CN 2-12 grossly intact with no focal deficits.  Romberg sign and cerebellar reflexes not assessed.  Psychiatric: Normal judgment and insight. Alert and oriented x 3.  Slightly depressed appearing and has a flat affect  Data Reviewed: I have personally reviewed following labs and imaging studies  CBC: Recent Labs  Lab 08/29/21 2224 09/02/21 1324 09/03/21 0358  WBC 7.5 7.5 6.2  NEUTROABS 4.8 5.0  --   HGB 14.3 13.1 11.9*  HCT 44.1 41.3 36.5  MCV 77.2* 79.3* 79.3*  PLT 557* 442* 387   Basic Metabolic Panel: Recent Labs  Lab 08/29/21 2224 08/30/21 0611 09/02/21 1324 09/02/21 1326 09/02/21 1937 09/03/21 0358  NA 136  --  138  --  140 141  K 3.4*  --  2.7*  --  3.4* 3.4*  CL 97*  --  100  --  109 110  CO2 22  --  24  --  21* 22  GLUCOSE 87  --  69*  --  101* 115*  BUN 5*  --  5*  --  <5* <5*  CREATININE 0.91  --  0.94  --  0.81 0.79  CALCIUM 9.2  --  9.2  --  8.1* 8.1*  MG  --  2.1  --  2.0  --  1.9   GFR: Estimated Creatinine Clearance: 139.9 mL/min (by C-G formula based on SCr of 0.79 mg/dL). Liver Function Tests: Recent Labs  Lab 08/29/21 2224 09/02/21 1324 09/03/21 0358  AST 74* 74* 57*  ALT 99* 102* 76*  ALKPHOS 78 76 63  BILITOT 1.6* 1.6* 1.1  PROT 8.6* 7.9 6.1*  ALBUMIN 3.7 3.6 2.7*   Recent Labs  Lab 09/02/21 1324  LIPASE 22   No results for input(s): "AMMONIA" in the last 168 hours. Coagulation Profile: No results for input(s): "INR", "PROTIME" in the last 168 hours. Cardiac Enzymes: No results for input(s): "CKTOTAL", "CKMB", "CKMBINDEX", "TROPONINI" in the last 168 hours. BNP (last 3 results) No results for input(s): "PROBNP" in the last 8760  hours. HbA1C: No results for input(s): "HGBA1C" in the last 72 hours. CBG: Recent Labs  Lab 09/02/21 1857 09/03/21 0002 09/03/21 0338 09/03/21 0733 09/03/21 1110  GLUCAP 81 131* 105* 102* 94   Lipid Profile: No results for input(s): "CHOL", "HDL", "LDLCALC", "TRIG", "CHOLHDL", "LDLDIRECT" in the last 72 hours. Thyroid Function Tests: No results for input(s): "TSH", "T4TOTAL", "FREET4", "T3FREE", "THYROIDAB" in the last 72 hours. Anemia Panel: No results for input(s): "VITAMINB12", "FOLATE", "FERRITIN", "TIBC", "IRON", "RETICCTPCT" in the last 72 hours. Sepsis Labs: No results for input(s): "PROCALCITON", "LATICACIDVEN" in the last 168 hours.  No results found for this or any previous visit (from the past 240 hour(s)).   Radiology Studies: CT ABDOMEN PELVIS W CONTRAST  Result Date: 09/03/2021 CLINICAL DATA:  Nausea/vomiting EXAM: CT ABDOMEN AND PELVIS WITH CONTRAST TECHNIQUE: Multidetector CT imaging of the abdomen and pelvis was performed using the standard protocol following bolus administration of intravenous contrast. RADIATION DOSE REDUCTION: This exam was performed according to the departmental dose-optimization program which includes automated exposure control, adjustment of the mA and/or kV according to patient size and/or use of iterative reconstruction technique. CONTRAST:  OMNIPAQUE IOHEXOL 300 MG/ML  SOLN COMPARISON:  None Available. FINDINGS: Lower chest: No acute abnormality. Hepatobiliary: No focal liver abnormality is seen. The gallbladder is unremarkable. Pancreas: Unremarkable. No pancreatic ductal dilatation or surrounding inflammatory changes. Spleen: Normal in size without focal abnormality. Adrenals/Urinary Tract: Adrenal glands are unremarkable. No hydronephrosis or nephrolithiasis. The bladder is minimally distended. Stomach/Bowel: The stomach is within normal limits. There is no evidence of bowel obstruction.The appendix is normal. Vascular/Lymphatic: No  significant vascular findings are present. No enlarged abdominal or pelvic lymph nodes. Reproductive: Unremarkable. Other: No abdominal wall hernia or abnormality. No abdominopelvic ascites. Musculoskeletal: No acute or significant osseous findings. IMPRESSION: No acute abdominopelvic abnormality. No bowel obstruction. Normal appendix. Electronically Signed   By: Caprice Renshaw M.D.   On: 09/03/2021 13:40   US Abdomen Limited RUQ (LIVER/GB)  Result Date: 09/03/2021 CLINICAL DATA:  Abnormal LFTs EXAM: ULTRASOUND ABDOMEN LIMITED RIGHT UPPER QUADRANT COMPARISON:  04/19/2016 FINDINGS: Gallbladder: Status post cholecystectomy. No sonographic Murphy sign noted by sonographer. Common bile duct: Diameter: 0.3 cm Liver: No focal lesion identified. Within normal limits in parenchymal echogenicity. Portal vein is patent on color Doppler imaging with normal direction of blood flow towards the  liver. Other: None. IMPRESSION: 1. No ultrasound abnormality of the right upper quadrant. 2. Status post cholecystectomy. Electronically Signed   By: Jearld Lesch M.D.   On: 09/03/2021 12:41     Scheduled Meds:  enoxaparin (LOVENOX) injection  40 mg Subcutaneous Q24H   feeding supplement  237 mL Oral BID BM   scopolamine  1 patch Transdermal Q72H   sodium chloride (PF)       Continuous Infusions:  dextrose 5 % and 0.45 % NaCl with KCl 20 mEq/L 125 mL/hr at 09/03/21 0813    LOS: 0 days   Marguerita Merles, DO [facility] Available via Epic secure chat 7am-7pm After these hours, please refer to coverage provider listed on [email or web address] 09/03/2021, 2:01 PM

## 2021-09-03 NOTE — Progress Notes (Signed)
Patient c/o numbness to bilateral arms, left more than right. Pulses equal but c/o "tingling" to fingertips. States this has been ongoing. MD [facility] aware- see new orders. PIV infusing to left [facility] with no difficulties. Offered site change but pt declines at this time. Will continue to monitor.

## 2021-09-04 DIAGNOSIS — R1013 Epigastric pain: Secondary | ICD-10-CM | POA: Diagnosis not present

## 2021-09-04 DIAGNOSIS — E876 Hypokalemia: Secondary | ICD-10-CM | POA: Diagnosis not present

## 2021-09-04 DIAGNOSIS — E162 Hypoglycemia, unspecified: Secondary | ICD-10-CM | POA: Diagnosis not present

## 2021-09-04 DIAGNOSIS — F5082 Avoidant/restrictive food intake disorder: Secondary | ICD-10-CM | POA: Diagnosis not present

## 2021-09-04 LAB — CBC WITH DIFFERENTIAL/PLATELET
Abs Immature Granulocytes: 0.02 10*3/uL (ref 0.00–0.07)
Basophils Absolute: 0 10*3/uL (ref 0.0–0.1)
Basophils Relative: 1 %
Eosinophils Absolute: 0.1 10*3/uL (ref 0.0–0.5)
Eosinophils Relative: 2 %
HCT: 37.6 % (ref 36.0–46.0)
Hemoglobin: 12.1 g/dL (ref 12.0–15.0)
Immature Granulocytes: 0 %
Lymphocytes Relative: 27 %
Lymphs Abs: 1.4 10*3/uL (ref 0.7–4.0)
MCH: 25.6 pg — ABNORMAL LOW (ref 26.0–34.0)
MCHC: 32.2 g/dL (ref 30.0–36.0)
MCV: 79.7 fL — ABNORMAL LOW (ref 80.0–100.0)
Monocytes Absolute: 0.7 10*3/uL (ref 0.1–1.0)
Monocytes Relative: 12 %
Neutro Abs: 3.1 10*3/uL (ref 1.7–7.7)
Neutrophils Relative %: 58 %
Platelets: 386 10*3/uL (ref 150–400)
RBC: 4.72 MIL/uL (ref 3.87–5.11)
RDW: 16.7 % — ABNORMAL HIGH (ref 11.5–15.5)
WBC: 5.3 10*3/uL (ref 4.0–10.5)
nRBC: 0 % (ref 0.0–0.2)

## 2021-09-04 LAB — GLUCOSE, CAPILLARY
Glucose-Capillary: 105 mg/dL — ABNORMAL HIGH (ref 70–99)
Glucose-Capillary: 114 mg/dL — ABNORMAL HIGH (ref 70–99)
Glucose-Capillary: 114 mg/dL — ABNORMAL HIGH (ref 70–99)
Glucose-Capillary: 119 mg/dL — ABNORMAL HIGH (ref 70–99)
Glucose-Capillary: 135 mg/dL — ABNORMAL HIGH (ref 70–99)
Glucose-Capillary: 97 mg/dL (ref 70–99)

## 2021-09-04 LAB — COMPREHENSIVE METABOLIC PANEL
ALT: 95 U/L — ABNORMAL HIGH (ref 0–44)
AST: 62 U/L — ABNORMAL HIGH (ref 15–41)
Albumin: 2.8 g/dL — ABNORMAL LOW (ref 3.5–5.0)
Alkaline Phosphatase: 65 U/L (ref 38–126)
Anion gap: 7 (ref 5–15)
BUN: <5 mg/dL — ABNORMAL LOW (ref 6–20)
CO2: 22 mmol/L (ref 22–32)
Calcium: 8.7 mg/dL — ABNORMAL LOW (ref 8.9–10.3)
Chloride: 113 mmol/L — ABNORMAL HIGH (ref 98–111)
Creatinine, Ser: 0.78 mg/dL (ref 0.44–1.00)
GFR, Estimated: >60 mL/min (ref >60)
Glucose, Bld: 115 mg/dL — ABNORMAL HIGH (ref 70–99)
Potassium: 3.9 mmol/L (ref 3.5–5.1)
Sodium: 142 mmol/L (ref 135–145)
Total Bilirubin: 0.9 mg/dL (ref 0.3–1.2)
Total Protein: 6.4 g/dL — ABNORMAL LOW (ref 6.5–8.1)

## 2021-09-04 LAB — MAGNESIUM: Magnesium: 1.6 mg/dL — ABNORMAL LOW (ref 1.7–2.4)

## 2021-09-04 LAB — PHOSPHORUS: Phosphorus: 2.3 mg/dL — ABNORMAL LOW (ref 2.5–4.6)

## 2021-09-04 MED ORDER — PHENOL 1.4 % MT LIQD
1.0000 | OROMUCOSAL | Status: DC | PRN
Start: 2021-09-04 — End: 2021-09-09
  Administered 2021-09-05 – 2021-09-07 (×2): 1 via OROMUCOSAL
  Filled 2021-09-04: qty 177

## 2021-09-04 MED ORDER — SUCRALFATE 1 GM/10ML PO SUSP
1.0000 g | Freq: Three times a day (TID) | ORAL | Status: DC
  Administered 2021-09-04 – 2021-09-08 (×10): 1 g via ORAL
  Filled 2021-09-04 (×12): qty 10

## 2021-09-04 MED ORDER — MAGNESIUM SULFATE 2 GM/50ML IV SOLN
2.0000 g | Freq: Once | INTRAVENOUS | Status: AC
Start: 2021-09-04 — End: 2021-09-04
  Administered 2021-09-04: 2 g via INTRAVENOUS
  Filled 2021-09-04: qty 50

## 2021-09-04 MED ORDER — POLYETHYLENE GLYCOL 3350 17 G PO PACK
17.0000 g | PACK | Freq: Two times a day (BID) | ORAL | Status: DC
  Filled 2021-09-04 (×2): qty 1

## 2021-09-04 MED ORDER — PANTOPRAZOLE SODIUM 40 MG IV SOLR
40.0000 mg | Freq: Two times a day (BID) | INTRAVENOUS | Status: DC
  Administered 2021-09-04 – 2021-09-08 (×8): 40 mg via INTRAVENOUS
  Filled 2021-09-04 (×9): qty 10

## 2021-09-04 MED ORDER — ONDANSETRON HCL 4 MG/2ML IJ SOLN
4.0000 mg | Freq: Four times a day (QID) | INTRAMUSCULAR | Status: DC
  Administered 2021-09-06: 4 mg via INTRAVENOUS
  Filled 2021-09-04 (×2): qty 2

## 2021-09-04 MED ORDER — POTASSIUM PHOSPHATES 15 MMOLE/5ML IV SOLN
20.0000 mmol | Freq: Once | INTRAVENOUS | Status: AC
  Administered 2021-09-04: 20 mmol via INTRAVENOUS
  Filled 2021-09-04: qty 6.67

## 2021-09-04 MED ORDER — METOCLOPRAMIDE HCL 5 MG/ML IJ SOLN
10.0000 mg | Freq: Once | INTRAMUSCULAR | Status: AC
  Administered 2021-09-04: 10 mg via INTRAVENOUS
  Filled 2021-09-04: qty 2

## 2021-09-04 MED ORDER — LORAZEPAM 2 MG/ML IJ SOLN
1.0000 mg | Freq: Four times a day (QID) | INTRAMUSCULAR | Status: DC | PRN
  Administered 2021-09-04: 1 mg via INTRAVENOUS
  Filled 2021-09-04: qty 1

## 2021-09-04 NOTE — Progress Notes (Signed)
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  The patient has been having persistent nausea which subsequently brought up eating disorders.  She has not been seen by surgery but she has been following behavioral health and admits to having a sore throat but no rhinorrhea, dyspnea, wheezing or hemoptysis.  She states that she has had persistent nausea for 4 weeks and avoids food due to the nausea.  In the ED she received a 1 L normal saline bolus and started on D10 at 50 MLS per hour and will switch to D5 half-normal saline +20 mEq of KCl given concern for hypoglycemia.  LFTs were slightly abnormal and T. bili was elevated.  Her hyperglycemia is improved but she continues to have significant nausea without vomiting and does have some occasional abdominal pain.  Psychiatry evaluated and feels that she is stable and can be discharged from their perspective but given her persistent nausea we will do further work-up with a CT scan of the abdomen pelvis as well as a right upper quadrant ultrasound.  Because the right upper quadrant ultrasound and the CT scan of the abdomen pelvis are negative we have consulted GI for further evaluation of her nausea.   GI evaluated and recommending scheduling antiemetics and starting MiraLAX 17 g p.o. twice daily for bowel regimen as well as PPI twice daily and sucralfate 1 g p.o. ACHS.  If there is no no improvement with medical therapy then GI will consider endoscopy.   Assessment and Plan: No notes have been filed under this hospital service. Service: Hospitalist  Persistent Nausea  without vomiting and recent abdominal pain Avoidant-Restrictive Food Intake Disorder -Likely in the setting of avoidant restrictive food disorder however need to rule out any underlying medical issues -Psychiatry was consulted and they felt that her unemployment and inability to live by herself have triggered stressors with her most recent eating disorder and they feel that she is stable to be discharged home with support system in place but given that she continues have nausea we have ordered a further work-up -CT scan of the abdomen pelvis done and showed "No acute abdominopelvic abnormality. No bowel obstruction. Normal appendix." -Right upper quadrant ultrasound showed "No ultrasound abnormality of the right upper quadrant. Status post cholecystectomy." -We will continue on a full liquid diet given her nausea -She is on D5 half-normal saline +20 mEq of KCl at 125 mL/h  -Given persistent nausea we will give added to scopolamine patch, Compazine 10 mg per 6 PR for nausea vomiting and will also add Zofran 4 mg every 6hprn  -We will check hemoglobin A1c to evaluate for diabetes but she could have a component of gastroparesis -GIs been consulted for further evaluation and recommendations and they are recommending scheduled antiemetics as well as PPI twice daily and sucralfate as well as starting bowel regimen with MiraLAX 17 g p.o. twice daily -GI recommends considering endoscopy if she fails medical therapy as above -Nutritionist has also been consulted for further evaluation given her poor p.o. intake   Hypokalemia -Mild at 3.4 and improved to 3.9 -Replete with D5 half-normal saline +20 mEq at 125  MLS per hour  -Mag Level was 1.6 -Continue to monitor and replete as necessary -Repeat CMP in a.m.  Hypomagnesemia -Patient's Mag Level was 1.6 -Replete with po IV Mag Sulfate 2 grams -Continue to Monitor and Replete as Necessary -Repeat Mag Level in the AM    Hypoglycemia -Mild and Imprvoed  and blood sugars ranging from 69-115 on daily BMP/CMP's; CBGs ranging from 91-119 -On IVF as above -Check HbA1c given Obesity -C/w CBG Monitoring -On Full Liquid Diet and will continue   Depression/OCD -Psych Consulted and recommending outpatient follow-up  Panic Attack -Given a dose of IV Lorazepam 1 mg every 6 as needed -If she continues to have panic attacks may need to reconsult psychiatry  Abnormal LFTs -AST went from 57 -> 62 -ALT went from 76 -> 95 -RUQ U/S showed "No ultrasound abnormality of the right upper quadrant. Status post cholecystectomy." -CT Scan Abd/Pelvis done and showed "No acute abdominopelvic abnormality. No bowel obstruction. Normal appendix." -GI Following -Continue to Monitor and Trend LFTs and repeat in the AM     Bilateral Arm Numbness and Tingling -Right > Left with tingling in her finger tips -Check MRI of Brain and Cervical Spine done and showed "No acute intracranial process. No evidence of acute or subacute infarct." and "No spinal canal stenosis or neural foraminal narrowing" respectively  -Check HbA1c to evaluate and pending  -May need to discuss with neurology -Likely will need outpatient EMG and nerve conduction studies and outpatient follow up with Neurology   Morbid Obesity -Complicates overall prognosis and care -Estimated body mass index is 49.8 kg/m as calculated from the following:   Height as of this encounter: 5' 2.5" (1.588 m).   Weight as of this encounter: 125.5 kg.  -Weight Loss and Dietary Counseling given  DVT prophylaxis: enoxaparin (LOVENOX) injection 40 mg Start: 09/02/21 1530    Code Status: Full Code Family Communication: No family currently at bedside this morning  Disposition Plan:  Level of care: Progressive Status is: Inpatient Remains inpatient appropriate because: Continues to have significant nausea and inability to take oral intake given her persistent nausea   Consultants:  Gastroenterology  Procedures:   CT Abd/Pelvis RUQ U/S MRI Brain and MRI Cervical Spine  Antimicrobials:  Anti-infectives (From admission, onward)    None       Subjective: Seen and examined at bedside and she is still extremely nauseous and not been able to eat because of the nausea.  Still feels okay.  States that she did not sleep very well last night due to the nausea.  No other concerns or complaints at this time.  Objective: Vitals:   09/03/21 1956 09/04/21 0300 09/04/21 0430 09/04/21 1154  BP: 109/83  (!) 113/54 126/87  Pulse: 61 76 77 90  Resp: 16  16 16   Temp: 97.7 F (36.5 C)  98.5 F (36.9 C) 98.3 F (36.8 C)  TempSrc: Oral  Oral Oral  SpO2: 99%  100% 100%  Weight:      Height:        Intake/Output Summary (Last 24 hours) at 09/04/2021 1350 Last data filed at 09/04/2021 0730 Gross per 24 hour  Intake 1740 ml  Output --  Net 1740 ml   Filed Weights   09/02/21 0955 09/02/21 1939  Weight: 121.6 kg 125.5 kg   Examination: Physical Exam:  Constitutional: WN/WD morbidly obese African-American female currently in no acute distress appears calm but then had a panic attack this afternoon and remains nauseous Respiratory: Mildly diminished  to auscultation bilaterally, no wheezing, rales, rhonchi or crackles. Normal respiratory effort and patient is not tachypenic. No accessory muscle use.  Unlabored breathing Cardiovascular: RRR, no murmurs / rubs / gallops. S1 and S2 auscultated. No extremity edema.  Abdomen: Soft, non-tender, distended secondary to body habitus. Bowel sounds positive.  GU: Deferred. Musculoskeletal: No clubbing / cyanosis of digits/nails. No joint deformity upper and lower extremities. Skin: No rashes, lesions, ulcers. No induration; Warm and dry.  Neurologic: CN 2-12 grossly intact with no focal deficits. Romberg sign and cerebellar reflexes not assessed.  Psychiatric: Normal judgment and insight. Alert and oriented x 3.   Data Reviewed: I have personally reviewed following  labs and imaging studies  CBC: Recent Labs  Lab 08/29/21 2224 09/02/21 1324 09/03/21 0358 09/04/21 0853  WBC 7.5 7.5 6.2 5.3  NEUTROABS 4.8 5.0  --  3.1  HGB 14.3 13.1 11.9* 12.1  HCT 44.1 41.3 36.5 37.6  MCV 77.2* 79.3* 79.3* 79.7*  PLT 557* 442* 387 386   Basic Metabolic Panel: Recent Labs  Lab 08/29/21 2224 08/30/21 0611 09/02/21 1324 09/02/21 1326 09/02/21 1937 09/03/21 0358 09/04/21 0853  NA 136  --  138  --  140 141 142  K 3.4*  --  2.7*  --  3.4* 3.4* 3.9  CL 97*  --  100  --  109 110 113*  CO2 22  --  24  --  21* 22 22  GLUCOSE 87  --  69*  --  101* 115* 115*  BUN 5*  --  5*  --  <5* <5* <5*  CREATININE 0.91  --  0.94  --  0.81 0.79 0.78  CALCIUM 9.2  --  9.2  --  8.1* 8.1* 8.7*  MG  --  2.1  --  2.0  --  1.9 1.6*  PHOS  --   --   --   --   --   --  2.3*   GFR: Estimated Creatinine Clearance: 139.9 mL/min (by C-G formula based on SCr of 0.78 mg/dL). Liver Function Tests: Recent Labs  Lab 08/29/21 2224 09/02/21 1324 09/03/21 0358 09/04/21 0853  AST 74* 74* 57* 62*  ALT 99* 102* 76* 95*  ALKPHOS 78 76 63 65  BILITOT 1.6* 1.6* 1.1 0.9  PROT 8.6* 7.9 6.1* 6.4*  ALBUMIN 3.7 3.6 2.7* 2.8*   Recent Labs  Lab 09/02/21 1324  LIPASE 22   No results for input(s): "AMMONIA" in the last 168 hours. Coagulation Profile: No results for input(s): "INR", "PROTIME" in the last 168 hours. Cardiac Enzymes: No results for input(s): "CKTOTAL", "CKMB", "CKMBINDEX", "TROPONINI" in the last 168 hours. BNP (last 3 results) No results for input(s): "PROBNP" in the last 8760 hours. HbA1C: No results for input(s): "HGBA1C" in the last 72 hours. CBG: Recent Labs  Lab 09/03/21 1953 09/04/21 0003 09/04/21 0425 09/04/21 0727 09/04/21 1110  GLUCAP 91 114* 119* 105* 97   Lipid Profile: No results for input(s): "CHOL", "HDL", "LDLCALC", "TRIG", "CHOLHDL", "LDLDIRECT" in the last 72 hours. Thyroid Function Tests: No results for input(s): "TSH", "T4TOTAL", "FREET4",  "T3FREE", "THYROIDAB" in the last 72 hours. Anemia Panel: No results for input(s): "VITAMINB12", "FOLATE", "FERRITIN", "TIBC", "IRON", "RETICCTPCT" in the last 72 hours. Sepsis Labs: No results for input(s): "PROCALCITON", "LATICACIDVEN" in the last 168 hours.  Recent Results (from the past 240 hour(s))  Urine Culture     Status: None (Preliminary result)   Collection Time: 09/03/21 11:19 AM   Specimen: Urine, Clean Catch  Result Value Ref Range Status   Specimen Description   Final    URINE, CLEAN CATCH Performed at [facility], [address] [place], [place] [address]    Special Requests   Final    NONE Performed at [facility], [address] [place], [place] [address]    Culture   Final    CULTURE REINCUBATED FOR BETTER GROWTH Performed at [facility], [address], [place], [place] [address]    Report Status PENDING  Incomplete     Radiology Studies: MR CERVICAL SPINE WO CONTRAST  Result Date: 09/04/2021 CLINICAL DATA:  Acute myelopathy in the cervical spine. EXAM: MRI CERVICAL SPINE WITHOUT CONTRAST TECHNIQUE: Multiplanar, multisequence MR imaging of the cervical spine was performed. No intravenous contrast was administered. COMPARISON:  None Available. FINDINGS: Alignment: Straightening of the normal cervical lordosis. No listhesis. Vertebrae: No acute fracture or suspicious osseous lesion. Cord: Normal signal and morphology. Posterior Fossa, vertebral arteries, paraspinal tissues: Negative. Disc levels: C2-C3: No significant disc bulge. No spinal canal stenosis or neuroforaminal narrowing. C3-C4: No significant disc bulge. No spinal canal stenosis or neuroforaminal narrowing. C4-C5: Minimal disc bulge. No spinal canal stenosis or neural foraminal narrowing. C5-C6: No significant disc bulge. No spinal canal stenosis or neuroforaminal narrowing. C6-C7: No significant disc bulge. No spinal canal stenosis or neuroforaminal  narrowing. C7-T1: No significant disc bulge. No spinal canal stenosis or neuroforaminal narrowing. IMPRESSION: No spinal canal stenosis or neural foraminal narrowing. Electronically Signed   By: Wiliam Ke M.D.   On: 09/04/2021 00:41   MR BRAIN WO CONTRAST  Result Date: 09/04/2021 CLINICAL DATA:  Stroke suspected EXAM: MRI HEAD WITHOUT CONTRAST TECHNIQUE: Multiplanar, multiecho pulse sequences of the brain and surrounding structures were obtained without intravenous contrast. COMPARISON:  No prior MRI, correlation is made with CT head 08/27/2021 FINDINGS: Brain: No restricted diffusion to suggest acute or subacute infarct. No acute hemorrhage, mass, mass effect, or midline shift. No hemosiderin deposition to suggest remote hemorrhage. No hydrocephalus or extra-axial collection. Pituitary within normal limits. Normal craniocervical junction. Vascular: Normal arterial flow voids. Skull and upper cervical spine: Normal marrow signal. Sinuses/Orbits: Mucous retention cysts in the bilateral maxillary sinuses. The orbits are unremarkable. Other: The mastoids are well aerated. IMPRESSION: No acute intracranial process. No evidence of acute or subacute infarct. Electronically Signed   By: Wiliam Ke M.D.   On: 09/04/2021 00:40   CT ABDOMEN PELVIS W CONTRAST  Result Date: 09/03/2021 CLINICAL DATA:  Nausea/vomiting EXAM: CT ABDOMEN AND PELVIS WITH CONTRAST TECHNIQUE: Multidetector CT imaging of the abdomen and pelvis was performed using the standard protocol following bolus administration of intravenous contrast. RADIATION DOSE REDUCTION: This exam was performed according to the departmental dose-optimization program which includes automated exposure control, adjustment of the mA and/or kV according to patient size and/or use of iterative reconstruction technique. CONTRAST:  OMNIPAQUE IOHEXOL 300 MG/ML  SOLN COMPARISON:  None Available. FINDINGS: Lower chest: No acute abnormality. Hepatobiliary: No focal  liver abnormality is seen. The gallbladder is unremarkable. Pancreas: Unremarkable. No pancreatic ductal dilatation or surrounding inflammatory changes. Spleen: Normal in size without focal abnormality. Adrenals/Urinary Tract: Adrenal glands are unremarkable. No hydronephrosis or nephrolithiasis. The bladder is minimally distended. Stomach/Bowel: The stomach is within normal limits. There is no evidence of bowel obstruction.The appendix is normal. Vascular/Lymphatic: No significant vascular findings are present. No enlarged abdominal or pelvic lymph nodes. Reproductive: Unremarkable. Other: No abdominal wall hernia or abnormality. No abdominopelvic ascites. Musculoskeletal: No  acute or significant osseous findings. IMPRESSION: No acute abdominopelvic abnormality. No bowel obstruction. Normal appendix. Electronically Signed   By: Caprice Renshaw M.D.   On: 09/03/2021 13:40   US Abdomen Limited RUQ (LIVER/GB)  Result Date: 09/03/2021 CLINICAL DATA:  Abnormal LFTs EXAM: ULTRASOUND ABDOMEN LIMITED RIGHT UPPER QUADRANT COMPARISON:  04/19/2016 FINDINGS: Gallbladder: Status post cholecystectomy. No sonographic Murphy sign noted by sonographer. Common bile duct: Diameter: 0.3 cm Liver: No focal lesion identified. Within normal limits in parenchymal echogenicity. Portal vein is patent on color Doppler imaging with normal direction of blood flow towards the liver. Other: None. IMPRESSION: 1. No ultrasound abnormality of the right upper quadrant. 2. Status post cholecystectomy. Electronically Signed   By: Jearld Lesch M.D.   On: 09/03/2021 12:41     Scheduled Meds:  enoxaparin (LOVENOX) injection  40 mg Subcutaneous Q24H   feeding supplement  237 mL Oral BID BM   ondansetron (ZOFRAN) IV  4 mg Intravenous QID   pantoprazole (PROTONIX) IV  40 mg Intravenous Q12H   polyethylene glycol  17 g Oral BID   scopolamine  1 patch Transdermal Q72H   sucralfate  1 g Oral TID WC & HS   Continuous Infusions:  dextrose 5 % and  0.45 % NaCl with KCl 20 mEq/L 125 mL/hr at 09/04/21 1307   potassium PHOSPHATE IVPB (in mmol) 20 mmol (09/04/21 1310)     LOS: 1 day   Marguerita Merles, DO [facility] Available via Epic secure chat 7am-7pm After these hours, please refer to coverage provider listed on [email or web address] 09/04/2021, 1:50 PM

## 2021-09-04 NOTE — Plan of Care (Signed)

## 2021-09-04 NOTE — Progress Notes (Addendum)
Initial Nutrition Assessment  DOCUMENTATION CODES:   Morbid obesity  INTERVENTION:  - will place referral for [facility] accepts Medicaid and able to do in-person and telehealth visits.  - recommend small bore NGT, could consider post-pyloric placement if gastroparesis/delayed gastric emptying is of concern, for initiation of tube feeding.   - consider advancement to Soft diet.   NUTRITION DIAGNOSIS:   Inadequate oral intake related to acute illness, nausea as evidenced by per patient/family report.  GOAL:   Patient will meet greater than or equal to 90% of their needs  MONITOR:   PO intake, Labs, Weight trends  REASON FOR ASSESSMENT:   Malnutrition Screening Tool, Consult Assessment of nutrition requirement/status, Poor PO  ASSESSMENT:   21 year old female with medical history of depression, OCD, avoidant-restrictive food disorder following cholecystectomy in 2020. She reported persistent nausea x4 weeks and that she avoids food d/t nausea. She was/is also experiencing intermittent abdominal pain. CT abdomen/pelvis and RUQ ultrasound both negative. GI consulted d/t persistent nausea. Possible need for endoscopy.  Patient sleeping through the majority of RD visit. She did briefly open her eyes on 2 occassions and was able to share that she has had no PO intake since 6/18, prior to that only taking in water for several days as last intake of solid food was a week ago, and last full meal was 4 weeks ago when she ate [employer].   All other information from patient's mom, who is at bedside. She shares changes and hardships that patient has experienced over the past 3 years since gallbladder surgery. She shares concern that patient being unable to follow up with Surgery team in person after the surgery was detrimental and contributed to eating disorder/disordered eating.   She shares that inpatient eating disorder treatment at [facility] was not  beneficial and that patient was spoken to [place] and not in a way that promoted recovery.   Active, empathetic listening provided.   Briefly introduced the idea of small bore NGT and initiation of TF. Provided information about outpatient RD to aid patient after d/c in ongoing care surrounding eating disorder.  Weight on 6/18 was 277 lb and weight on 08/26/21 was 279 lb. PTA the most recently documented weight was 189 lb at [facility] on 10/27/19. No information documented in the edema section of flow sheet this hospitalization.     Labs reviewed; CBGs: 114, 119, 105, 97 mg/dl, Cl: 527 mmol/l, BUN: <5 mg/dl, Ca: 8.7 mg/dl, LFTs elevated.   Medications reviewed; 2 g IV Mg sulfate x1 run 6/20, 10 mg IV reglan x1 dose 6/20, 40 mg IV protonix BID, 17 g miralax BID, 20 mmol IV KPhos x1 run 6/20, 1 g carafate TID.  IVF; D5-1/2 NS-20 mEq KCl @ 125 ml/hr (510 kcal/24 hrs).    NUTRITION - FOCUSED PHYSICAL EXAM:  Deferred as unable to obtain consent from patient.  Diet Order:   Diet Order             Diet full liquid Room service appropriate? Yes; Fluid consistency: Thin  Diet effective now                   EDUCATION NEEDS:   Education needs have been addressed  Skin:  Skin Assessment: Reviewed RN Assessment  Last BM:  PTA/unknown  Height:   Ht Readings from Last 1 Encounters:  09/02/21 5' 2.5" (1.588 m)    Weight:   Wt Readings from Last 1 Encounters:  09/02/21  125.5 kg     BMI:  Body mass index is 49.8 kg/m.  Estimated Nutritional Needs:  Kcal:  1900-2100 kcal Protein:  95-110 grams Fluid:  >/= 2.2 L/day     Trenton Gammon, MS, RD, LDN, CNSC Registered Dietitian II Inpatient Clinical Nutrition RD pager # and on-call/weekend pager # available in [facility]

## 2021-09-04 NOTE — Consult Note (Signed)
Referring Provider: [facility] Primary Care Physician:  Pcp, No Primary Gastroenterologist:  Gentry Fitz  Reason for Consultation:  Nausea  HPI: Kristen Pacheco is a 21 y.o. 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  Patient reports persistent nausea ongoing for 4 weeks. Eating/drinking make nausea worse. Nothing makes nausea better. Has tried zofran without relief. Denies vomiting. Reported mild abdominal discomfort a few days ago that has since dissipated. Patient states since 4 weeks ago her bowel movements have also been inconsistent. She may have diarrhea for a few days and then not have a bowel movement for a few days. Reports straining with bowel movements and feeling of incomplete evacuation.  Denies heartburn.  Denies melena/hematochezia. Denies family history of colon cancer or other GI malignancies.   Past Medical History:  Diagnosis Date   Avoidant-restrictive food intake disorder (ARFID)    Depression    OCD (obsessive compulsive disorder)     Past Surgical History:  Procedure Laterality Date   AXILLARY LYMPH NODE BIOPSY     CHOLECYSTECTOMY     WISDOM TOOTH EXTRACTION      Prior to Admission medications   Medication Sig Start Date End Date Taking? Authorizing Provider  ondansetron [facility]) 4 MG/5ML solution Take 5 mLs (4 mg total) by mouth every 6 (six) hours as needed for nausea or vomiting. 08/30/21  Yes Fayrene Helper, PA-C  ibuprofen (ADVIL,MOTRIN) 100 MG/5ML suspension Take 30 mLs (600 mg total) by mouth every 8 (eight) hours as needed for mild pain or moderate pain. Patient not taking: Reported on 09/02/2021 10/19/15   Trixie Dredge, PA-C  potassium chloride (KLOR-CON) 10 MEQ tablet Take 2 tablets (20 mEq total) by mouth daily for 5 days. Patient not taking: Reported on 09/02/2021 09/08/19 09/02/21  Farrel Gordon, PA-C  promethazine (PHENERGAN) 12.5 MG suppository Place 1 suppository (12.5 mg total) rectally every 6 (six) hours as needed for up to 10 doses for nausea or vomiting. Patient not taking: Reported on 09/02/2021 08/30/21   Fayrene Helper, PA-C    Scheduled Meds:  enoxaparin (LOVENOX) injection  40 mg Subcutaneous Q24H   feeding supplement  237 mL Oral BID BM   scopolamine  1 patch Transdermal Q72H   Continuous Infusions:  dextrose 5 % and 0.45 % NaCl with KCl 20 mEq/L 125 mL/hr at 09/04/21 0606   PRN Meds:.acetaminophen **OR** acetaminophen, ondansetron (ZOFRAN) IV, prochlorperazine  Allergies as of 09/02/2021 - Review Complete 09/02/2021  Allergen Reaction Noted   Pollen extract Itching and Other (See Comments) 04/01/2018   Tape Other (See Comments) 09/02/2021    Family History  Problem Relation Age of Onset   Hypercholesterolemia Mother    Hypertension Mother    Hypertension Father    Hypercholesterolemia Father    Hypertension Other    Hypercholesterolemia Other    Diabetes Mellitus II Other     Social History   Socioeconomic History   Marital status: Single    Spouse name: Not on file   Number of children: Not on file   Years of education: Not on file   Highest education level: Not on file  Occupational History   Not on file  Tobacco Use   Smoking status: Never   Smokeless tobacco: Never  Vaping Use   Vaping Use: Never used  Substance and Sexual Activity   Alcohol use: No   Drug  use: No   Sexual activity: Not on file  Other Topics Concern   Not on file  Social History Narrative   Not on file   Social Determinants of Health   Financial Resource Strain: Not on file  Food Insecurity: Not on file  Transportation Needs: Not on file  Physical Activity: Not on file  Stress: Not on file  Social Connections: Not on file  Intimate Partner Violence: Not on file    Review of Systems: Review of Systems  Constitutional:  Negative for chills, fever and  weight loss.  HENT:  Negative for hearing loss and tinnitus.   Eyes:  Negative for blurred vision and double vision.  Respiratory:  Negative for cough and hemoptysis.   Cardiovascular:  Negative for chest pain and palpitations.  Gastrointestinal:  Positive for abdominal pain and nausea. Negative for blood in stool, constipation, diarrhea, heartburn, melena and vomiting.  Genitourinary:  Negative for dysuria and urgency.  Musculoskeletal:  Negative for myalgias and neck pain.  Skin:  Negative for itching and rash.  Neurological:  Negative for seizures and loss of consciousness.  Psychiatric/Behavioral:  Negative for depression and suicidal ideas.      Physical Exam:Physical Exam Constitutional:      Appearance: She is obese.  HENT:     Nose: Nose normal. No congestion.     Mouth/Throat:     Mouth: Mucous membranes are moist.     Pharynx: Oropharynx is clear.  Eyes:     Extraocular Movements: Extraocular movements intact.     Conjunctiva/sclera: Conjunctivae normal.  Cardiovascular:     Rate and Rhythm: Normal rate and regular rhythm.  Pulmonary:     Effort: Pulmonary effort is normal.     Breath sounds: Normal breath sounds.  Abdominal:     General: Abdomen is flat. Bowel sounds are normal. There is no distension.     Palpations: Abdomen is soft. There is no mass.     Tenderness: There is no abdominal tenderness. There is no guarding or rebound.     Hernia: No hernia is present.  Musculoskeletal:        General: No swelling. Normal range of motion.     Cervical back: Normal range of motion. No rigidity.  Skin:    General: Skin is warm.     Coloration: Skin is not jaundiced.  Neurological:     General: No focal deficit present.     Mental Status: She is alert and oriented to person, place, and time.  Psychiatric:        Mood and Affect: Mood normal.        Behavior: Behavior normal.        Thought Content: Thought content normal.        Judgment: Judgment normal.      Vital signs: Vitals:   09/04/21 0300 09/04/21 0430  BP:  (!) 113/54  Pulse: 76 77  Resp:  16  Temp:  98.5 F (36.9 C)  SpO2:  100%   Last BM Date : 09/03/21 ("loose" per patient)    GI:  Lab Results: Recent Labs    09/02/21 1324 09/03/21 0358  WBC 7.5 6.2  HGB 13.1 11.9*  HCT 41.3 36.5  PLT 442* 387   BMET Recent Labs    09/02/21 1324 09/02/21 1937 09/03/21 0358  NA 138 140 141  K 2.7* 3.4* 3.4*  CL 100 109 110  CO2 24 21* 22  GLUCOSE 69* 101* 115*  BUN 5* <5* <5*  CREATININE 0.94 0.81 0.79  CALCIUM 9.2 8.1* 8.1*   LFT Recent Labs    09/03/21 0358  PROT 6.1*  ALBUMIN 2.7*  AST 57*  ALT 76*  ALKPHOS 63  BILITOT 1.1   PT/INR No results for input(s): "LABPROT", "INR" in the last 72 hours.   Studies/Results: MR CERVICAL SPINE WO CONTRAST  Result Date: 09/04/2021 CLINICAL DATA:  Acute myelopathy in the cervical spine. EXAM: MRI CERVICAL SPINE WITHOUT CONTRAST TECHNIQUE: Multiplanar, multisequence MR imaging of the cervical spine was performed. No intravenous contrast was administered. COMPARISON:  None Available. FINDINGS: Alignment: Straightening of the normal cervical lordosis. No listhesis. Vertebrae: No acute fracture or suspicious osseous lesion. Cord: Normal signal and morphology. Posterior Fossa, vertebral arteries, paraspinal tissues: Negative. Disc levels: C2-C3: No significant disc bulge. No spinal canal stenosis or neuroforaminal narrowing. C3-C4: No significant disc bulge. No spinal canal stenosis or neuroforaminal narrowing. C4-C5: Minimal disc bulge. No spinal canal stenosis or neural foraminal narrowing. C5-C6: No significant disc bulge. No spinal canal stenosis or neuroforaminal narrowing. C6-C7: No significant disc bulge. No spinal canal stenosis or neuroforaminal narrowing. C7-T1: No significant disc bulge. No spinal canal stenosis or neuroforaminal narrowing. IMPRESSION: No spinal canal stenosis or neural foraminal narrowing. Electronically  Signed   By: Wiliam Ke M.D.   On: 09/04/2021 00:41   MR BRAIN WO CONTRAST  Result Date: 09/04/2021 CLINICAL DATA:  Stroke suspected EXAM: MRI HEAD WITHOUT CONTRAST TECHNIQUE: Multiplanar, multiecho pulse sequences of the brain and surrounding structures were obtained without intravenous contrast. COMPARISON:  No prior MRI, correlation is made with CT head 08/27/2021 FINDINGS: Brain: No restricted diffusion to suggest acute or subacute infarct. No acute hemorrhage, mass, mass effect, or midline shift. No hemosiderin deposition to suggest remote hemorrhage. No hydrocephalus or extra-axial collection. Pituitary within normal limits. Normal craniocervical junction. Vascular: Normal arterial flow voids. Skull and upper cervical spine: Normal marrow signal. Sinuses/Orbits: Mucous retention cysts in the bilateral maxillary sinuses. The orbits are unremarkable. Other: The mastoids are well aerated. IMPRESSION: No acute intracranial process. No evidence of acute or subacute infarct. Electronically Signed   By: Wiliam Ke M.D.   On: 09/04/2021 00:40   CT ABDOMEN PELVIS W CONTRAST  Result Date: 09/03/2021 CLINICAL DATA:  Nausea/vomiting EXAM: CT ABDOMEN AND PELVIS WITH CONTRAST TECHNIQUE: Multidetector CT imaging of the abdomen and pelvis was performed using the standard protocol following bolus administration of intravenous contrast. RADIATION DOSE REDUCTION: This exam was performed according to the departmental dose-optimization program which includes automated exposure control, adjustment of the mA and/or kV according to patient size and/or use of iterative reconstruction technique. CONTRAST:  OMNIPAQUE IOHEXOL 300 MG/ML  SOLN COMPARISON:  None Available. FINDINGS: Lower chest: No acute abnormality. Hepatobiliary: No focal liver abnormality is seen. The gallbladder is unremarkable. Pancreas: Unremarkable. No pancreatic ductal dilatation or surrounding inflammatory changes. Spleen: Normal in size without  focal abnormality. Adrenals/Urinary Tract: Adrenal glands are unremarkable. No hydronephrosis or nephrolithiasis. The bladder is minimally distended. Stomach/Bowel: The stomach is within normal limits. There is no evidence of bowel obstruction.The appendix is normal. Vascular/Lymphatic: No significant vascular findings are present. No enlarged abdominal or pelvic lymph nodes. Reproductive: Unremarkable. Other: No abdominal wall hernia or abnormality. No abdominopelvic ascites. Musculoskeletal: No acute or significant osseous findings. IMPRESSION: No acute abdominopelvic abnormality. No bowel obstruction. Normal appendix. Electronically Signed   By: Caprice Renshaw M.D.   On: 09/03/2021 13:40   US Abdomen Limited RUQ (LIVER/GB)  Result Date: 09/03/2021 CLINICAL DATA:  Abnormal  LFTs EXAM: ULTRASOUND ABDOMEN LIMITED RIGHT UPPER QUADRANT COMPARISON:  04/19/2016 FINDINGS: Gallbladder: Status post cholecystectomy. No sonographic Murphy sign noted by sonographer. Common bile duct: Diameter: 0.3 cm Liver: No focal lesion identified. Within normal limits in parenchymal echogenicity. Portal vein is patent on color Doppler imaging with normal direction of blood flow towards the liver. Other: None. IMPRESSION: 1. No ultrasound abnormality of the right upper quadrant. 2. Status post cholecystectomy. Electronically Signed   By: Jearld Lesch M.D.   On: 09/03/2021 12:41    Impression: Persistent nausea - CT ab pelvis with contrast: normal - RUQ [place]: s/p cholecystectomy.  Otherwise normal -Mildly elevated LFTs, AST 57/ALT 76 -T. bili 1.1 -Lipase normal   Plan: Persistent nausea with negative CT and RUQ ultrasound.  However, with description of abnormal bowel movements could be suspicious for constipation causing patient's nausea.  Could consider KUB to evaluate for stool burden. Could also consider EGD for further evaluation of persistent nausea, though this can likely be done as an outpatient versus  inpatient. Continue supportive care [facility] will follow    LOS: 1 day   Legrand Como  PA-C 09/04/2021, 7:58 AM  Contact #  [phone]

## 2021-09-05 DIAGNOSIS — R8281 Pyuria: Secondary | ICD-10-CM

## 2021-09-05 DIAGNOSIS — F32A Depression, unspecified: Secondary | ICD-10-CM | POA: Diagnosis not present

## 2021-09-05 DIAGNOSIS — E876 Hypokalemia: Secondary | ICD-10-CM | POA: Diagnosis not present

## 2021-09-05 DIAGNOSIS — E162 Hypoglycemia, unspecified: Secondary | ICD-10-CM | POA: Diagnosis not present

## 2021-09-05 DIAGNOSIS — F5082 Avoidant/restrictive food intake disorder: Secondary | ICD-10-CM | POA: Diagnosis not present

## 2021-09-05 DIAGNOSIS — R202 Paresthesia of skin: Secondary | ICD-10-CM

## 2021-09-05 LAB — GLUCOSE, CAPILLARY
Glucose-Capillary: 105 mg/dL — ABNORMAL HIGH (ref 70–99)
Glucose-Capillary: 105 mg/dL — ABNORMAL HIGH (ref 70–99)
Glucose-Capillary: 107 mg/dL — ABNORMAL HIGH (ref 70–99)
Glucose-Capillary: 110 mg/dL — ABNORMAL HIGH (ref 70–99)
Glucose-Capillary: 94 mg/dL (ref 70–99)
Glucose-Capillary: 98 mg/dL (ref 70–99)

## 2021-09-05 LAB — HEMOGLOBIN A1C
Hgb A1c MFr Bld: 5.2 % (ref 4.8–5.6)
Mean Plasma Glucose: 103 mg/dL

## 2021-09-05 LAB — DIFFERENTIAL
Abs Immature Granulocytes: 0.04 10*3/uL (ref 0.00–0.07)
Basophils Absolute: 0 10*3/uL (ref 0.0–0.1)
Basophils Relative: 1 %
Eosinophils Absolute: 0.2 10*3/uL (ref 0.0–0.5)
Eosinophils Relative: 3 %
Immature Granulocytes: 1 %
Lymphocytes Relative: 18 %
Lymphs Abs: 1.1 10*3/uL (ref 0.7–4.0)
Monocytes Absolute: 0.7 10*3/uL (ref 0.1–1.0)
Monocytes Relative: 11 %
Neutro Abs: 4.2 10*3/uL (ref 1.7–7.7)
Neutrophils Relative %: 66 %

## 2021-09-05 LAB — MAGNESIUM: Magnesium: 1.8 mg/dL (ref 1.7–2.4)

## 2021-09-05 LAB — COMPREHENSIVE METABOLIC PANEL
ALT: 86 U/L — ABNORMAL HIGH (ref 0–44)
AST: 57 U/L — ABNORMAL HIGH (ref 15–41)
Albumin: 2.5 g/dL — ABNORMAL LOW (ref 3.5–5.0)
Alkaline Phosphatase: 60 U/L (ref 38–126)
Anion gap: 6 (ref 5–15)
BUN: <5 mg/dL — ABNORMAL LOW (ref 6–20)
CO2: 21 mmol/L — ABNORMAL LOW (ref 22–32)
Calcium: 8.1 mg/dL — ABNORMAL LOW (ref 8.9–10.3)
Chloride: 112 mmol/L — ABNORMAL HIGH (ref 98–111)
Creatinine, Ser: 0.65 mg/dL (ref 0.44–1.00)
GFR, Estimated: >60 mL/min (ref >60)
Glucose, Bld: 112 mg/dL — ABNORMAL HIGH (ref 70–99)
Potassium: 4.5 mmol/L (ref 3.5–5.1)
Sodium: 139 mmol/L (ref 135–145)
Total Bilirubin: 0.9 mg/dL (ref 0.3–1.2)
Total Protein: 5.7 g/dL — ABNORMAL LOW (ref 6.5–8.1)

## 2021-09-05 LAB — CBC
HCT: 39.4 % (ref 36.0–46.0)
Hemoglobin: 12.1 g/dL (ref 12.0–15.0)
MCH: 25.7 pg — ABNORMAL LOW (ref 26.0–34.0)
MCHC: 30.7 g/dL (ref 30.0–36.0)
MCV: 83.7 fL (ref 80.0–100.0)
Platelets: 393 10*3/uL (ref 150–400)
RBC: 4.71 MIL/uL (ref 3.87–5.11)
RDW: 17.2 % — ABNORMAL HIGH (ref 11.5–15.5)
WBC: 6.2 10*3/uL (ref 4.0–10.5)
nRBC: 0 % (ref 0.0–0.2)

## 2021-09-05 LAB — URINE CULTURE: Culture: 30000 — AB

## 2021-09-05 LAB — PHOSPHORUS: Phosphorus: 3 mg/dL (ref 2.5–4.6)

## 2021-09-05 MED ORDER — BISMUTH SUBSALICYLATE 262 MG/15ML PO SUSP
30.0000 mL | Freq: Once | ORAL | Status: AC
Start: 2021-09-05 — End: 2021-09-05
  Administered 2021-09-05: 30 mL via ORAL
  Filled 2021-09-05: qty 236

## 2021-09-05 MED ORDER — DEXTROSE IN LACTATED RINGERS 5 % IV SOLN: INTRAVENOUS | Status: DC

## 2021-09-05 MED ORDER — SODIUM CHLORIDE 0.9 % IV SOLN
6.2500 mg | Freq: Once | INTRAVENOUS | Status: AC
  Administered 2021-09-05: 6.25 mg via INTRAVENOUS
  Filled 2021-09-05 (×4): qty 0.25

## 2021-09-05 MED ORDER — VITAMIN B-6 50 MG PO TABS
50.0000 mg | ORAL_TABLET | Freq: Every day | ORAL | Status: DC
  Filled 2021-09-05 (×5): qty 1

## 2021-09-05 MED ORDER — LOPERAMIDE HCL 2 MG PO CAPS
2.0000 mg | ORAL_CAPSULE | Freq: Once | ORAL | Status: DC
  Filled 2021-09-05: qty 1

## 2021-09-05 MED ORDER — MIRTAZAPINE 15 MG PO TABS
15.0000 mg | ORAL_TABLET | Freq: Every day | ORAL | Status: DC
  Filled 2021-09-05: qty 1

## 2021-09-05 MED ORDER — ADULT MULTIVITAMIN LIQUID CH
15.0000 mL | Freq: Every day | ORAL | Status: DC
  Filled 2021-09-05 (×5): qty 15

## 2021-09-05 MED ORDER — ADULT MULTIVITAMIN W/MINERALS CH: 1.0000 | ORAL_TABLET | Freq: Every day | ORAL | Status: DC

## 2021-09-05 NOTE — Progress Notes (Signed)
NUTRITION NOTE  Full RD assessment note yesterday at 1519.  Patient noted to be more awake today. Briefly stopped in patient's room. Mom and RN at bedside. Patient reports that she has not consumed anything PO today and that she is scared to try.   Due to eating disorder, she is particular about foods that she will accept and about temperatures of foods.   Talked with patient about nausea and exacerbating factors, which can include prolonged period without adequate intake as a physiological hunger symptom. After discussion, patient willing to try strawberry italian ice. Provided patient with one from floor stock.  Referral for outpatient RD placed yesterday and this RD communicated with that RD via secure chat. Made patient aware that referral was entered so that appointment can be scheduled after d/c.     Trenton Gammon, MS, RD, LDN, CNSC Registered Dietitian II Inpatient Clinical Nutrition RD pager # and on-call/weekend pager # available in [facility]

## 2021-09-05 NOTE — Progress Notes (Signed)
[facility] Progress Note  Kristen Pacheco 21 y.o. 2000/10/09  CC:  persistent nausea   Subjective: Patient states she continues to have nausea, no vomiting. Denies pain. Has had a few loose bowel movements.  ROS : Review of Systems  Constitutional:  Negative for chills and fever.  Gastrointestinal:  Positive for nausea. Negative for abdominal pain, blood in stool, constipation, diarrhea, heartburn, melena and vomiting.      Objective: Vital signs in last 24 hours: Vitals:   09/04/21 2157 09/05/21 0456  BP: 104/62 118/62  Pulse: 85 84  Resp: 18 20  Temp: 98 F (36.7 C) 98.1 F (36.7 C)  SpO2: 100% 100%    Physical Exam:  General:  Alert, cooperative, no distress, appears stated age  Head:  Normocephalic, without obvious abnormality, atraumatic  Eyes:  Anicteric sclera, EOM's intact  Lungs:   Clear to auscultation bilaterally, respirations unlabored  Heart:  Regular rate and rhythm, S1, S2 normal  Abdomen:   Soft, non-tender, bowel sounds active all four quadrants,  no masses,     Lab Results: Recent Labs    09/04/21 0853 09/05/21 0535  NA 142 139  K 3.9 4.5  CL 113* 112*  CO2 22 21*  GLUCOSE 115* 112*  BUN <5* <5*  CREATININE 0.78 0.65  CALCIUM 8.7* 8.1*  MG 1.6* 1.8  PHOS 2.3* 3.0   Recent Labs    09/04/21 0853 09/05/21 0535  AST 62* 57*  ALT 95* 86*  ALKPHOS 65 60  BILITOT 0.9 0.9  PROT 6.4* 5.7*  ALBUMIN 2.8* 2.5*   Recent Labs    09/02/21 1324 09/03/21 0358 09/04/21 0853  WBC 7.5 6.2 5.3  NEUTROABS 5.0  --  3.1  HGB 13.1 11.9* 12.1  HCT 41.3 36.5 37.6  MCV 79.3* 79.3* 79.7*  PLT 442* 387 386   No results for input(s): "LABPROT", "INR" in the last 72 hours.    Assessment Persistent nausea - CT ab pelvis with contrast: normal - RUQ [place]: s/p cholecystectomy.  Otherwise normal -Mildly elevated LFTs, AST 57/ALT 86 -T. bili 1.1 -Lipase normal   Plan: No improvement with nausea with the addition of miralax, PPI and  carafate. History of previous EGD with [facility] 10/2019 for persistent nausea, showed normal stomach, duodenum, no evidence to explain symptoms. May consider repeat EGD due to continued symptoms, though likely multi-factorial. [facility] will follow  Legrand Como PA-C 09/05/2021, 10:42 AM  Contact #  [phone]

## 2021-09-05 NOTE — Progress Notes (Signed)
PROGRESS NOTE  Kristen Pacheco TKW:[id number] DOB: 28-Apr-2000   PCP: Pcp, No  Patient is from: Home  DOA: 09/02/2021 LOS: 2  Chief complaints Chief Complaint  Patient presents with   Nausea     Brief Narrative / Interim history: 21 year old F with PMH of cholecystectomy, avoidant restrictive eating disorder, anxiety, depression, OCD and morbid obesity presenting with persistent nausea and sore throat.  She was admitted for hypoglycemia in the setting of persistent nausea and concern for eating disorder.  CT abdomen and pelvis without significant finding to explain patient's symptoms.  She was started on dextrose infusion.  Psychiatry and GI consulted.  Psychiatry felt that her unemployment and inability to live by herself have triggered stressors with her most recent eating disorder and they feel that she is stable to be discharged home with support system in place.  GI recommended scheduled antiemetics, PPI and MiraLAX and possible endoscopy if no improvement.   Subjective: Seen and examined earlier this morning.  No major events overnight of this morning.  Continues to endorse nausea and dry heaving.  She also reports diarrhea, about 4 bowel movement in the last 24 hours.  She denies melena or hematochezia.  Denies abdominal pain.  She also reports nausea with Zofran.   Objective: Vitals:   09/04/21 1154 09/04/21 2100 09/04/21 2157 09/05/21 0456  BP: 126/87  104/62 118/62  Pulse: 90  85 84  Resp: 16 14 18 20   Temp: 98.3 F (36.8 C)  98 F (36.7 C) 98.1 F (36.7 C)  TempSrc: Oral   Oral  SpO2: 100%  100% 100%  Weight:      Height:        Examination:  GENERAL: No apparent distress.  Nontoxic. HEENT: MMM.  Vision and hearing grossly intact.  NECK: Supple.  No apparent JVD.  RESP:  No IWOB.  Fair aeration bilaterally. CVS:  RRR. Heart sounds normal.  ABD/GI/GU: BS+. Abd soft, NTND.  MSK/EXT:  Moves extremities. No apparent deformity. No edema.  SKIN: no apparent skin  lesion or wound NEURO: Awake, alert and oriented appropriately.  No apparent focal neuro deficit. PSYCH: Calm. Normal affect.   Procedures:  None  Microbiology summarized: Urine culture reintubated for better gross.  Assessment and plan: Principal Problem:   Hypoglycemia Active Problems:   Depressive disorder   Avoidant-restrictive food intake disorder (ARFID)   Hypokalemia   Abdominal pain   Nausea without vomiting   Pyuria   Paresthesia of upper limb  Persistent nausea without vomiting-suspect this to be psychogenic. Avoidant-restrictive food intake disorder -CT abdomen and pelvis and RUQ without acute finding. -Reportedly normal EGD at [facility] in 2021. -Appreciate input by psych-could be triggered by stressors.  Recommended support -Appreciate input by GI-scheduled antiemetics, PPI, Carafate and MiraLAX -Patient refusing Zofran stating worsening nausea with Zofran -Discussed multivitamin and Remeron.  Willing to try -Decrease D5 fluid to 75 cc an hour -EGD?  Hypoglycemia: Resolved but on IV dextrose.  A1c 5.2%. -Check a.m. cortisol, insulin level and C-peptide.  Hypokalemia/hypomagnesemia: Resolved. -Continue monitoring  Anxiety/depression/OCD: Does not seem to be on medication for this.  She states she was previously on Prozac that she stopped taking. -Defer to psychiatry  Abnormal LFT: Relatively stable. -Monitor intermittently  Bilateral upper extremity paresthesia: Nutritional deficiency?  MRI brain and MRI cervical spine without acute finding. -Add multivitamin and B6  Pyuria/hematuria-UA with > 50 RBC, moderate Hgb, moderate LE and few bacteria.  Patient denies UTI symptoms.  Urine culture pending.  Morbid obesity Body mass index is 49.8 kg/m. Nutrition Problem: Inadequate oral intake Etiology: acute illness, nausea Signs/Symptoms: per patient/family report Interventions: Refer to RD note for recommendations   DVT prophylaxis:  enoxaparin (LOVENOX)  injection 40 mg Start: 09/02/21 1530  Code Status: Full code Family Communication: Updated patient's mother at bedside with patient's permission.  Permission obtained with patient's mother out of the room Level of care: Med-Surg.   Status is: Inpatient Remains inpatient appropriate because: Refractory nausea and hypoglycemia requiring dextrose infusion.   Final disposition: TBD Consultants:  Psychiatry Gastroenterology  Sch Meds:  Scheduled Meds:  enoxaparin (LOVENOX) injection  40 mg Subcutaneous Q24H   feeding supplement  237 mL Oral BID BM   multivitamin with minerals  1 tablet Oral Daily   ondansetron (ZOFRAN) IV  4 mg Intravenous QID   pantoprazole (PROTONIX) IV  40 mg Intravenous Q12H   polyethylene glycol  17 g Oral BID   vitamin B-6  50 mg Oral Daily   scopolamine  1 patch Transdermal Q72H   sucralfate  1 g Oral TID WC & HS   Continuous Infusions:  dextrose 5% lactated ringers     PRN Meds:.acetaminophen **OR** acetaminophen, LORazepam, phenol, prochlorperazine  Antimicrobials: Anti-infectives (From admission, onward)    None        I have personally reviewed the following labs and images: CBC: Recent Labs  Lab 08/29/21 2224 09/02/21 1324 09/03/21 0358 09/04/21 0853 09/05/21 1044  WBC 7.5 7.5 6.2 5.3 6.2  NEUTROABS 4.8 5.0  --  3.1 4.2  HGB 14.3 13.1 11.9* 12.1 12.1  HCT 44.1 41.3 36.5 37.6 39.4  MCV 77.2* 79.3* 79.3* 79.7* 83.7  PLT 557* 442* 387 386 393   BMP &GFR Recent Labs  Lab 08/30/21 0611 09/02/21 1324 09/02/21 1326 09/02/21 1937 09/03/21 0358 09/04/21 0853 09/05/21 0535  NA  --  138  --  140 141 142 139  K  --  2.7*  --  3.4* 3.4* 3.9 4.5  CL  --  100  --  109 110 113* 112*  CO2  --  24  --  21* 22 22 21*  GLUCOSE  --  69*  --  101* 115* 115* 112*  BUN  --  5*  --  <5* <5* <5* <5*  CREATININE  --  0.94  --  0.81 0.79 0.78 0.65  CALCIUM  --  9.2  --  8.1* 8.1* 8.7* 8.1*  MG 2.1  --  2.0  --  1.9 1.6* 1.8  PHOS  --   --   --    --   --  2.3* 3.0   Estimated Creatinine Clearance: 139.9 mL/min (by C-G formula based on SCr of 0.65 mg/dL). Liver & Pancreas: Recent Labs  Lab 08/29/21 2224 09/02/21 1324 09/03/21 0358 09/04/21 0853 09/05/21 0535  AST 74* 74* 57* 62* 57*  ALT 99* 102* 76* 95* 86*  ALKPHOS 78 76 63 65 60  BILITOT 1.6* 1.6* 1.1 0.9 0.9  PROT 8.6* 7.9 6.1* 6.4* 5.7*  ALBUMIN 3.7 3.6 2.7* 2.8* 2.5*   Recent Labs  Lab 09/02/21 1324  LIPASE 22   No results for input(s): "AMMONIA" in the last 168 hours. Diabetic: Recent Labs    09/04/21 0505  HGBA1C 5.2   Recent Labs  Lab 09/04/21 2047 09/05/21 0003 09/05/21 0343 09/05/21 0732 09/05/21 1114  GLUCAP 114* 105* 110* 94 98   Cardiac Enzymes: No results for input(s): "CKTOTAL", "CKMB", "CKMBINDEX", "TROPONINI" in the last 168  hours. No results for input(s): "PROBNP" in the last 8760 hours. Coagulation Profile: No results for input(s): "INR", "PROTIME" in the last 168 hours. Thyroid Function Tests: No results for input(s): "TSH", "T4TOTAL", "FREET4", "T3FREE", "THYROIDAB" in the last 72 hours. Lipid Profile: No results for input(s): "CHOL", "HDL", "LDLCALC", "TRIG", "CHOLHDL", "LDLDIRECT" in the last 72 hours. Anemia Panel: No results for input(s): "VITAMINB12", "FOLATE", "FERRITIN", "TIBC", "IRON", "RETICCTPCT" in the last 72 hours. Urine analysis:    Component Value Date/Time   COLORURINE RED (A) 09/02/2021 1641   APPEARANCEUR HAZY (A) 09/02/2021 1641   LABSPEC 1.014 09/02/2021 1641   PHURINE 6.0 09/02/2021 1641   GLUCOSEU >=500 (A) 09/02/2021 1641   HGBUR MODERATE (A) 09/02/2021 1641   BILIRUBINUR NEGATIVE 09/02/2021 1641   KETONESUR 80 (A) 09/02/2021 1641   PROTEINUR 100 (A) 09/02/2021 1641   NITRITE NEGATIVE 09/02/2021 1641   LEUKOCYTESUR MODERATE (A) 09/02/2021 1641   Sepsis Labs: Invalid input(s): "PROCALCITONIN", "LACTICIDVEN"  Microbiology: Recent Results (from the past 240 hour(s))  Urine Culture     Status: None  (Preliminary result)   Collection Time: 09/03/21 11:19 AM   Specimen: Urine, Clean Catch  Result Value Ref Range Status   Specimen Description   Final    URINE, CLEAN CATCH Performed at [facility], [address] [place], [place] [address]    Special Requests   Final    NONE Performed at [facility], [address], [place], [place] [address]    Culture   Final    CULTURE REINCUBATED FOR BETTER GROWTH Performed at [facility], [address], [place], [place] [address]    Report Status PENDING  Incomplete    Radiology Studies: No results found.    Amberrose Friebel T. Cenia Zaragosa [facility]  If 7PM-7AM, please contact night-coverage [email or web address] 09/05/2021, 12:44 PM

## 2021-09-06 DIAGNOSIS — E66813 Obesity, class 3: Secondary | ICD-10-CM

## 2021-09-06 DIAGNOSIS — F5082 Avoidant/restrictive food intake disorder: Secondary | ICD-10-CM | POA: Diagnosis not present

## 2021-09-06 DIAGNOSIS — E876 Hypokalemia: Secondary | ICD-10-CM | POA: Diagnosis not present

## 2021-09-06 DIAGNOSIS — F39 Unspecified mood [affective] disorder: Secondary | ICD-10-CM

## 2021-09-06 DIAGNOSIS — R1013 Epigastric pain: Secondary | ICD-10-CM | POA: Diagnosis not present

## 2021-09-06 DIAGNOSIS — E162 Hypoglycemia, unspecified: Secondary | ICD-10-CM | POA: Diagnosis not present

## 2021-09-06 LAB — GLUCOSE, CAPILLARY
Glucose-Capillary: 103 mg/dL — ABNORMAL HIGH (ref 70–99)
Glucose-Capillary: 111 mg/dL — ABNORMAL HIGH (ref 70–99)
Glucose-Capillary: 93 mg/dL (ref 70–99)
Glucose-Capillary: 98 mg/dL (ref 70–99)

## 2021-09-06 LAB — CORTISOL: Cortisol, Plasma: 15.9 ug/dL

## 2021-09-06 MED ORDER — METOCLOPRAMIDE HCL 5 MG/ML IJ SOLN: 5.0000 mg | Freq: Three times a day (TID) | INTRAMUSCULAR | Status: DC

## 2021-09-06 MED ORDER — MIRTAZAPINE 15 MG PO TBDP
15.0000 mg | ORAL_TABLET | Freq: Every day | ORAL | Status: DC
  Administered 2021-09-06 – 2021-09-08 (×3): 15 mg via ORAL
  Filled 2021-09-06 (×3): qty 1

## 2021-09-06 NOTE — Progress Notes (Signed)
PROGRESS NOTE  Kristen Pacheco YQM:[id number] DOB: 05/10/2000   PCP: Pcp, No  Patient is from: Home  DOA: 09/02/2021 LOS: 3  Chief complaints Chief Complaint  Patient presents with   Nausea     Brief Narrative / Interim history: 21 year old F with PMH of cholecystectomy, avoidant restrictive eating disorder, anxiety, depression, OCD and morbid obesity presenting with persistent nausea and sore throat.  She was admitted for hypoglycemia in the setting of persistent nausea and concern for eating disorder.  CT abdomen and pelvis without significant finding to explain patient's symptoms.  She was started on dextrose infusion.  Psychiatry and GI consulted.  Psychiatry felt that her unemployment and inability to live by herself have triggered stressors with her most recent eating disorder and they feel that she is stable to be discharged home with support system in place.  GI recommended scheduled antiemetics, PPI, Carafate and MiraLAX and possible endoscopy if no improvement.   Subjective: Seen and examined earlier this morning.  No major events overnight of this morning.  Continues to complain nausea and abdominal pain.  Responding to my questions by using her computer stating that talking and swallowing saliva makes her nauseous.  She also states nausea with Zofran.  She has been refusing all her medications except Carafate and Protonix.  Patient's mother at bedside.  Objective: Vitals:   09/05/21 2031 09/06/21 0551 09/06/21 1256 09/06/21 1304  BP: 104/68 109/85 112/83   Pulse: (!) 108 (!) 103 (!) 123 (!) 109  Resp: 20 20 18    Temp: 98.2 F (36.8 C) 98.3 F (36.8 C) 98.8 F (37.1 C)   TempSrc: Oral Oral Oral   SpO2: 100% 100% 99%   Weight:      Height:        Examination:  GENERAL: No apparent distress.  Nontoxic. HEENT: MMM.  Vision and hearing grossly intact.  NECK: Supple.  No apparent JVD.  RESP:  No IWOB.  Fair aeration bilaterally. CVS:  RRR. Heart sounds normal.   ABD/GI/GU: BS+. Abd soft, NTND.  Mild discomfort to palpation. MSK/EXT:  Moves extremities. No apparent deformity. No edema.  SKIN: no apparent skin lesion or wound NEURO: Awake and alert. Oriented appropriately.  No apparent focal neuro deficit. PSYCH: Calm. Normal affect.   Procedures:  None  Microbiology summarized: Urine culture reintubated for better gross.  Assessment and plan: Principal Problem:   Hypoglycemia Active Problems:   Depressive disorder   Avoidant-restrictive food intake disorder (ARFID)   Hypokalemia   Abdominal pain   Nausea without vomiting   Pyuria   Paresthesia of upper limb  Persistent nausea without vomiting-suspect this to be psychogenic. Avoidant-restrictive food intake disorder -CT abdomen and pelvis and RUQ [place] without acute finding. -Reportedly normal EGD at [facility] in 2021. -Appreciate input by psych-could be triggered by stressors.  Recommended support -Appreciate input by GI-scheduled antiemetics, PPI, Carafate and MiraLAX -Patient refusing all her meds except Protonix and Carafate. -Encouraged to try multivitamin and Remeron. -Decrease D5 fluid to 50 cc an hour. -GI planning EGD on 09/07/2021 if patient and family decides.  Hypoglycemia: Resolved but on IV dextrose.  A1c 5.2%.  TSH and a.m. cortisol within normal. -Decrease D5-LR to 50 cc an hour -Encourage oral intake -Follow C-peptide and insulin level  Hypokalemia/hypomagnesemia: Resolved. -Continue monitoring  Anxiety/depression/OCD/eating disorder: states taking Prozac at some point but she stopped -Defer to psychiatry  Abnormal LFT: Relatively stable. -Monitor intermittently  Bilateral upper extremity paresthesia: Nutritional deficiency?  MRI brain and MRI cervical  spine without acute finding. -Add multivitamin and B6  Pyuria/hematuria-UA with > 50 RBC, moderate Hgb, moderate LE and few bacteria.  Patient denies UTI symptoms.  Urine culture pending.  Morbid obesity Body mass  index is 49.8 kg/m. Nutrition Problem: Inadequate oral intake Etiology: acute illness, nausea Signs/Symptoms: per patient/family report Interventions: Refer to RD note for recommendations   DVT prophylaxis:  Place and maintain sequential compression device Start: 09/05/21 2225 enoxaparin (LOVENOX) injection 40 mg Start: 09/02/21 1530  Code Status: Full code Family Communication: Updated patient's mother at bedside with patient's permission. Level of care: Med-Surg.   Status is: Inpatient Remains inpatient appropriate because: Refractory nausea and hypoglycemia requiring dextrose infusion.   Final disposition: TBD Consultants:  Psychiatry Gastroenterology  Sch Meds:  Scheduled Meds:  enoxaparin (LOVENOX) injection  40 mg Subcutaneous Q24H   feeding supplement  237 mL Oral BID BM   mirtazapine  15 mg Oral QHS   multivitamin  15 mL Oral Daily   ondansetron (ZOFRAN) IV  4 mg Intravenous QID   pantoprazole (PROTONIX) IV  40 mg Intravenous Q12H   polyethylene glycol  17 g Oral BID   vitamin B-6  50 mg Oral Daily   sucralfate  1 g Oral TID WC & HS   Continuous Infusions:  dextrose 5% lactated ringers 50 mL/hr at 09/06/21 0828   PRN Meds:.acetaminophen **OR** acetaminophen, LORazepam, phenol, prochlorperazine  Antimicrobials: Anti-infectives (From admission, onward)    None        I have personally reviewed the following labs and images: CBC: Recent Labs  Lab 09/02/21 1324 09/03/21 0358 09/04/21 0853 09/05/21 1044  WBC 7.5 6.2 5.3 6.2  NEUTROABS 5.0  --  3.1 4.2  HGB 13.1 11.9* 12.1 12.1  HCT 41.3 36.5 37.6 39.4  MCV 79.3* 79.3* 79.7* 83.7  PLT 442* 387 386 393   BMP &GFR Recent Labs  Lab 09/02/21 1324 09/02/21 1326 09/02/21 1937 09/03/21 0358 09/04/21 0853 09/05/21 0535  NA 138  --  140 141 142 139  K 2.7*  --  3.4* 3.4* 3.9 4.5  CL 100  --  109 110 113* 112*  CO2 24  --  21* 22 22 21*  GLUCOSE 69*  --  101* 115* 115* 112*  BUN 5*  --  <5* <5*  <5* <5*  CREATININE 0.94  --  0.81 0.79 0.78 0.65  CALCIUM 9.2  --  8.1* 8.1* 8.7* 8.1*  MG  --  2.0  --  1.9 1.6* 1.8  PHOS  --   --   --   --  2.3* 3.0   Estimated Creatinine Clearance: 139.9 mL/min (by C-G formula based on SCr of 0.65 mg/dL). Liver & Pancreas: Recent Labs  Lab 09/02/21 1324 09/03/21 0358 09/04/21 0853 09/05/21 0535  AST 74* 57* 62* 57*  ALT 102* 76* 95* 86*  ALKPHOS 76 63 65 60  BILITOT 1.6* 1.1 0.9 0.9  PROT 7.9 6.1* 6.4* 5.7*  ALBUMIN 3.6 2.7* 2.8* 2.5*   Recent Labs  Lab 09/02/21 1324  LIPASE 22   No results for input(s): "AMMONIA" in the last 168 hours. Diabetic: Recent Labs    09/04/21 0505  HGBA1C 5.2   Recent Labs  Lab 09/05/21 1651 09/05/21 2021 09/06/21 0013 09/06/21 0549 09/06/21 1139  GLUCAP 107* 105* 111* 103* 98   Cardiac Enzymes: No results for input(s): "CKTOTAL", "CKMB", "CKMBINDEX", "TROPONINI" in the last 168 hours. No results for input(s): "PROBNP" in the last 8760 hours. Coagulation Profile: No results  for input(s): "INR", "PROTIME" in the last 168 hours. Thyroid Function Tests: No results for input(s): "TSH", "T4TOTAL", "FREET4", "T3FREE", "THYROIDAB" in the last 72 hours. Lipid Profile: No results for input(s): "CHOL", "HDL", "LDLCALC", "TRIG", "CHOLHDL", "LDLDIRECT" in the last 72 hours. Anemia Panel: No results for input(s): "VITAMINB12", "FOLATE", "FERRITIN", "TIBC", "IRON", "RETICCTPCT" in the last 72 hours. Urine analysis:    Component Value Date/Time   COLORURINE RED (A) 09/02/2021 1641   APPEARANCEUR HAZY (A) 09/02/2021 1641   LABSPEC 1.014 09/02/2021 1641   PHURINE 6.0 09/02/2021 1641   GLUCOSEU >=500 (A) 09/02/2021 1641   HGBUR MODERATE (A) 09/02/2021 1641   BILIRUBINUR NEGATIVE 09/02/2021 1641   KETONESUR 80 (A) 09/02/2021 1641   PROTEINUR 100 (A) 09/02/2021 1641   NITRITE NEGATIVE 09/02/2021 1641   LEUKOCYTESUR MODERATE (A) 09/02/2021 1641   Sepsis Labs: Invalid input(s): "PROCALCITONIN",  "LACTICIDVEN"  Microbiology: Recent Results (from the past 240 hour(s))  Urine Culture     Status: Abnormal   Collection Time: 09/03/21 11:19 AM   Specimen: Urine, Clean Catch  Result Value Ref Range Status   Specimen Description   Final    URINE, CLEAN CATCH Performed at [facility], [address] [place], [place] [address]    Special Requests   Final    NONE Performed at [facility], [address], [place], [place] [address]    Culture (A)  Final    30,000 COLONIES/mL MULTIPLE SPECIES PRESENT, SUGGEST RECOLLECTION   Report Status 09/05/2021 FINAL  Final    Radiology Studies: No results found.    Oralia Criger T. Jahmiyah Dullea [facility]  If 7PM-7AM, please contact night-coverage [email or web address] 09/06/2021, 2:24 PM

## 2021-09-06 NOTE — TOC Progression Note (Signed)
Transition of Care [facility]) - Progression Note    Patient Details  Name: Kristen Pacheco MRN: [id number] Date of Birth: 2000-06-08  Transition of [facility] CM/SW Contact  Golda Acre, RN Phone Number: 09/06/2021, 7:48 AM  Clinical Narrative:    Following for toc needs.  Discharge is planned for home with self care.   Expected Discharge Plan: Home/Self Care Barriers to Discharge: Continued Medical Work up  Expected Discharge Plan and Services Expected Discharge Plan: Home/Self Care   Discharge Planning Services: CM Consult   Living arrangements for the past 2 months: Apartment                                       Social Determinants of Health (SDOH) Interventions    Readmission Risk Interventions     No data to display

## 2021-09-06 NOTE — Progress Notes (Signed)
[facility] Progress Note  Kristen Pacheco 21 y.o. 2000/05/17  CC: Persistent nausea   Subjective: Patient seen and examined at bedside.  Patient continuing to report persistent nausea, no vomiting.  Denies abdominal pain.  Continues to refuse MiraLAX but is able to take Carafate and Protonix IV  ROS : Review of Systems  Constitutional:  Negative for chills and fever.  Gastrointestinal:  Positive for nausea. Negative for abdominal pain, blood in stool, constipation, diarrhea, heartburn, melena and vomiting.      Objective: Vital signs in last 24 hours: Vitals:   09/05/21 2031 09/06/21 0551  BP: 104/68 109/85  Pulse: (!) 108 (!) 103  Resp: 20 20  Temp: 98.2 F (36.8 C) 98.3 F (36.8 C)  SpO2: 100% 100%    Physical Exam:  General:  Obese female sitting comfortably in bed, no distress  Head:  Normocephalic, without obvious abnormality, atraumatic  Eyes:  Anicteric sclera, EOM's intact  Lungs:   Clear to auscultation bilaterally, respirations unlabored  Heart:  Regular rate and rhythm, S1, S2 normal  Abdomen:   Soft, protuberant, non-tender, bowel sounds active all four quadrants,  no masses,     Lab Results: Recent Labs    09/04/21 0853 09/05/21 0535  NA 142 139  K 3.9 4.5  CL 113* 112*  CO2 22 21*  GLUCOSE 115* 112*  BUN <5* <5*  CREATININE 0.78 0.65  CALCIUM 8.7* 8.1*  MG 1.6* 1.8  PHOS 2.3* 3.0   Recent Labs    09/04/21 0853 09/05/21 0535  AST 62* 57*  ALT 95* 86*  ALKPHOS 65 60  BILITOT 0.9 0.9  PROT 6.4* 5.7*  ALBUMIN 2.8* 2.5*   Recent Labs    09/04/21 0853 09/05/21 1044  WBC 5.3 6.2  NEUTROABS 3.1 4.2  HGB 12.1 12.1  HCT 37.6 39.4  MCV 79.7* 83.7  PLT 386 393   No results for input(s): "LABPROT", "INR" in the last 72 hours.    Assessment Persistent nausea - CT ab pelvis with contrast: normal - RUQ [place]: s/p cholecystectomy.  Otherwise normal -Mildly elevated LFTs, AST 57/ALT 86 -T. bili 1.1 -Lipase normal -EGD 10/2019  with [facility] showed normal stomach, normal duodenum, no evidence to explain symptoms   Plan: Persistent nausea despite medical management.  Discussed with patient and mother consideration of repeating EGD for further evaluation.  They state they will think about it and let us know later today. spot held for tomorrow. I thoroughly discussed the procedures to include nature, alternatives, benefits, and risks including but not limited to bleeding, perforation, infection, anesthesia/cardiac and pulmonary complications. Patient provides understanding. Continue supportive care [facility] will follow  Legrand Como PA-C 09/06/2021, 10:13 AM  Contact #  [phone]

## 2021-09-07 ENCOUNTER — Encounter (HOSPITAL_COMMUNITY)
Admission: EM | Disposition: A | Discharge status: Home / Self Care | Payer: Self-pay | Source: Home / Self Care | Attending: Student

## 2021-09-07 DIAGNOSIS — R1013 Epigastric pain: Secondary | ICD-10-CM | POA: Diagnosis not present

## 2021-09-07 DIAGNOSIS — F5082 Avoidant/restrictive food intake disorder: Secondary | ICD-10-CM | POA: Diagnosis not present

## 2021-09-07 DIAGNOSIS — E162 Hypoglycemia, unspecified: Secondary | ICD-10-CM | POA: Diagnosis not present

## 2021-09-07 DIAGNOSIS — E876 Hypokalemia: Secondary | ICD-10-CM | POA: Diagnosis not present

## 2021-09-07 LAB — RENAL FUNCTION PANEL
Albumin: 2.7 g/dL — ABNORMAL LOW (ref 3.5–5.0)
Anion gap: 9 (ref 5–15)
BUN: <5 mg/dL — ABNORMAL LOW (ref 6–20)
CO2: 24 mmol/L (ref 22–32)
Calcium: 8.7 mg/dL — ABNORMAL LOW (ref 8.9–10.3)
Chloride: 109 mmol/L (ref 98–111)
Creatinine, Ser: 0.64 mg/dL (ref 0.44–1.00)
GFR, Estimated: >60 mL/min (ref >60)
Glucose, Bld: 90 mg/dL (ref 70–99)
Phosphorus: 3.7 mg/dL (ref 2.5–4.6)
Potassium: 4 mmol/L (ref 3.5–5.1)
Sodium: 142 mmol/L (ref 135–145)

## 2021-09-07 LAB — INSULIN, RANDOM: Insulin: 7.7 u[IU]/mL (ref 2.6–24.9)

## 2021-09-07 LAB — MAGNESIUM: Magnesium: 1.7 mg/dL (ref 1.7–2.4)

## 2021-09-07 LAB — GLUCOSE, CAPILLARY
Glucose-Capillary: 79 mg/dL (ref 70–99)
Glucose-Capillary: 90 mg/dL (ref 70–99)
Glucose-Capillary: 93 mg/dL (ref 70–99)
Glucose-Capillary: 94 mg/dL (ref 70–99)

## 2021-09-07 LAB — CBC
HCT: 40.3 % (ref 36.0–46.0)
Hemoglobin: 12.3 g/dL (ref 12.0–15.0)
MCH: 25.2 pg — ABNORMAL LOW (ref 26.0–34.0)
MCHC: 30.5 g/dL (ref 30.0–36.0)
MCV: 82.4 fL (ref 80.0–100.0)
Platelets: 327 10*3/uL (ref 150–400)
RBC: 4.89 MIL/uL (ref 3.87–5.11)
RDW: 17.3 % — ABNORMAL HIGH (ref 11.5–15.5)
WBC: 9 10*3/uL (ref 4.0–10.5)
nRBC: 0.2 % (ref 0.0–0.2)

## 2021-09-07 LAB — C-PEPTIDE: C-Peptide: 3.3 ng/mL (ref 1.1–4.4)

## 2021-09-07 SURGERY — EGD (ESOPHAGOGASTRODUODENOSCOPY)
Anesthesia: Monitor Anesthesia Care

## 2021-09-07 NOTE — Progress Notes (Signed)
Patient lost IV access.  EGD was canceled.  I have spoken with Dr. Alanda Slim, and advised him to call us back when/if IV access is restored, at which point we could reconsider having endoscopy.

## 2021-09-08 ENCOUNTER — Encounter (HOSPITAL_COMMUNITY)
Admission: EM | Disposition: A | Discharge status: Home / Self Care | Payer: Self-pay | Source: Home / Self Care | Attending: Student

## 2021-09-08 ENCOUNTER — Inpatient Hospital Stay (HOSPITAL_COMMUNITY): Payer: Medicaid Other | Admitting: Certified Registered Nurse Anesthetist

## 2021-09-08 ENCOUNTER — Encounter (HOSPITAL_COMMUNITY): Payer: Self-pay | Admitting: Certified Registered Nurse Anesthetist

## 2021-09-08 DIAGNOSIS — R112 Nausea with vomiting, unspecified: Secondary | ICD-10-CM

## 2021-09-08 DIAGNOSIS — Z6841 Body Mass Index (BMI) 40.0 and over, adult: Secondary | ICD-10-CM

## 2021-09-08 DIAGNOSIS — F5082 Avoidant/restrictive food intake disorder: Secondary | ICD-10-CM | POA: Diagnosis not present

## 2021-09-08 DIAGNOSIS — F418 Other specified anxiety disorders: Secondary | ICD-10-CM

## 2021-09-08 DIAGNOSIS — E162 Hypoglycemia, unspecified: Secondary | ICD-10-CM | POA: Diagnosis not present

## 2021-09-08 DIAGNOSIS — E876 Hypokalemia: Secondary | ICD-10-CM | POA: Diagnosis not present

## 2021-09-08 DIAGNOSIS — R1013 Epigastric pain: Secondary | ICD-10-CM | POA: Diagnosis not present

## 2021-09-08 HISTORY — PX: ESOPHAGOGASTRODUODENOSCOPY (EGD) WITH PROPOFOL: SHX5813

## 2021-09-08 LAB — PREGNANCY, URINE: Preg Test, Ur: NEGATIVE

## 2021-09-08 LAB — GLUCOSE, CAPILLARY
Glucose-Capillary: 100 mg/dL — ABNORMAL HIGH (ref 70–99)
Glucose-Capillary: 114 mg/dL — ABNORMAL HIGH (ref 70–99)
Glucose-Capillary: 77 mg/dL (ref 70–99)
Glucose-Capillary: 90 mg/dL (ref 70–99)
Glucose-Capillary: 91 mg/dL (ref 70–99)

## 2021-09-08 SURGERY — ESOPHAGOGASTRODUODENOSCOPY (EGD) WITH PROPOFOL
Anesthesia: Monitor Anesthesia Care

## 2021-09-08 MED ORDER — METOCLOPRAMIDE HCL 5 MG/ML IJ SOLN
5.0000 mg | Freq: Four times a day (QID) | INTRAMUSCULAR | Status: DC | PRN
  Administered 2021-09-08: 5 mg via INTRAVENOUS
  Filled 2021-09-08: qty 2

## 2021-09-08 MED ORDER — SODIUM CHLORIDE 0.9 % IV SOLN: INTRAVENOUS | Status: DC

## 2021-09-08 MED ORDER — PROPOFOL 500 MG/50ML IV EMUL
INTRAVENOUS | Status: DC | PRN
  Administered 2021-09-08: 125 ug/kg/min via INTRAVENOUS

## 2021-09-08 MED ORDER — LACTATED RINGERS IV SOLN
INTRAVENOUS | Status: AC | PRN
  Administered 2021-09-08: 10 mL/h via INTRAVENOUS

## 2021-09-08 SURGICAL SUPPLY — 15 items

## 2021-09-08 NOTE — Progress Notes (Signed)
PIV consult to evaluate RUE PIV. Catheter kinked at exit site. Dressing changed, stat-lock applied to decrease pistoning with arm movement. Brisk blood return noted.

## 2021-09-08 NOTE — Progress Notes (Signed)
Patient back from endoscopy. She is alert, oriented, calm, requested ice water, jello and icee.  Offered to order food for patient and educated patient how to order using room phone and menu; she verbalized understanding but declined at this time. Will cont to monitor.

## 2021-09-09 DIAGNOSIS — E876 Hypokalemia: Secondary | ICD-10-CM | POA: Diagnosis not present

## 2021-09-09 DIAGNOSIS — E162 Hypoglycemia, unspecified: Secondary | ICD-10-CM | POA: Diagnosis not present

## 2021-09-09 DIAGNOSIS — F5082 Avoidant/restrictive food intake disorder: Secondary | ICD-10-CM | POA: Diagnosis not present

## 2021-09-09 DIAGNOSIS — R1013 Epigastric pain: Secondary | ICD-10-CM | POA: Diagnosis not present

## 2021-09-09 LAB — CREATININE, SERUM
Creatinine, Ser: 0.79 mg/dL (ref 0.44–1.00)
GFR, Estimated: >60 mL/min (ref >60)

## 2021-09-09 LAB — RENAL FUNCTION PANEL
Albumin: 2.4 g/dL — ABNORMAL LOW (ref 3.5–5.0)
Anion gap: 8 (ref 5–15)
BUN: <5 mg/dL — ABNORMAL LOW (ref 6–20)
CO2: 26 mmol/L (ref 22–32)
Calcium: 8.2 mg/dL — ABNORMAL LOW (ref 8.9–10.3)
Chloride: 108 mmol/L (ref 98–111)
Creatinine, Ser: 0.78 mg/dL (ref 0.44–1.00)
GFR, Estimated: >60 mL/min (ref >60)
Glucose, Bld: 83 mg/dL (ref 70–99)
Phosphorus: 4.1 mg/dL (ref 2.5–4.6)
Potassium: 3.7 mmol/L (ref 3.5–5.1)
Sodium: 142 mmol/L (ref 135–145)

## 2021-09-09 LAB — GLUCOSE, CAPILLARY: Glucose-Capillary: 77 mg/dL (ref 70–99)

## 2021-09-09 LAB — CBC
HCT: 37.6 % (ref 36.0–46.0)
Hemoglobin: 11.6 g/dL — ABNORMAL LOW (ref 12.0–15.0)
MCH: 25.5 pg — ABNORMAL LOW (ref 26.0–34.0)
MCHC: 30.9 g/dL (ref 30.0–36.0)
MCV: 82.6 fL (ref 80.0–100.0)
Platelets: 363 10*3/uL (ref 150–400)
RBC: 4.55 MIL/uL (ref 3.87–5.11)
RDW: 17 % — ABNORMAL HIGH (ref 11.5–15.5)
WBC: 7.8 10*3/uL (ref 4.0–10.5)
nRBC: 0 % (ref 0.0–0.2)

## 2021-09-09 LAB — MAGNESIUM: Magnesium: 1.7 mg/dL (ref 1.7–2.4)

## 2021-09-09 MED ORDER — MIRTAZAPINE 15 MG PO TBDP: 15.0000 mg | ORAL_TABLET | Freq: Every day | ORAL | 1 refills | Status: DC

## 2021-09-09 MED ORDER — PANTOPRAZOLE SODIUM 40 MG PO TBEC: 40.0000 mg | DELAYED_RELEASE_TABLET | Freq: Every day | ORAL | 0 refills | Status: AC

## 2021-09-09 MED ORDER — POLYETHYLENE GLYCOL 3350 17 GM/SCOOP PO POWD: 17.0000 g | Freq: Two times a day (BID) | ORAL | 0 refills | Status: DC | PRN

## 2021-09-09 MED ORDER — ADULT MULTIVITAMIN LIQUID CH: 15.0000 mL | Freq: Every day | ORAL | 0 refills | Status: AC

## 2021-09-09 MED ORDER — SENNOSIDES-DOCUSATE SODIUM 8.6-50 MG PO TABS: 1.0000 | ORAL_TABLET | Freq: Two times a day (BID) | ORAL | 0 refills | Status: DC | PRN

## 2021-09-09 MED ORDER — PROMETHAZINE HCL 12.5 MG RE SUPP: 12.5000 mg | Freq: Three times a day (TID) | RECTAL | 0 refills | Status: DC | PRN

## 2021-09-09 MED ORDER — METOCLOPRAMIDE HCL 5 MG PO TABS: 5.0000 mg | ORAL_TABLET | Freq: Three times a day (TID) | ORAL | 0 refills | Status: DC

## 2021-09-09 NOTE — Discharge Summary (Signed)
Physician Discharge Summary  Kristen Pacheco ZOX:[id number] DOB: [birth year] DOA: 09/02/2021  PCP: Pcp, No  Admit date: 09/02/2021 Discharge date: 09/09/2021 Admitted From: Home Disposition: Home Recommendations for Outpatient Follow-up:  Follow ups as below. Please obtain CBC and CMP at follow-up Ensure outpatient follow-up with dietitian and possibly psychiatry Patient may benefit from outpatient psychotherapy. Please follow up on the following pending results: None  Home Health: Not indicated Equipment/Devices: Not indicated  Discharge Condition: Stable CODE STATUS: Full code  Follow-up Information     [facility] Follow up on 09/14/2021.   Why: appointment 1030am. Please arrive 30 minutes early to complete all paper work. Contact information: [address] [place] [address] [phone]                Hospital course 21 year old F with PMH of cholecystectomy, avoidant restrictive eating disorder, anxiety, depression, OCD and morbid obesity presenting with persistent nausea and sore throat.  She was admitted for hypoglycemia in the setting of persistent nausea and concern for eating disorder.  CT abdomen and pelvis without significant finding to explain patient's symptoms.  She was started on dextrose infusion.  Psychiatry and GI consulted.  Psychiatry felt that her unemployment and inability to live by herself have triggered stressors with her most recent eating disorder and they feel that she is stable to be discharged home with support system in place.  Started on scheduled antiemetics, PPI, Carafate and MiraLAX per GI recommendation but patient has been refusing antiemetics and MiraLAX attributing to increased nausea and abdominal discomfort.   Patient underwent EGD on 09/08/2021 which was basically normal.  Patient was started on Remeron and multivitamin.  P.o. intake improved.  She was weaned off IV  dextrose infusion.  She maintained euglycemia of IV dextrose for 24 hours.  Electrolytes remained stable.  Discharged on p.o. Remeron, multivitamin, Protonix, Reglan or Phenergan and bowel regimen (Senokot-S).  Patient refused MiraLAX and Zofran.  Ambulatory referral to dietitian placed.  She was provided with resources for behavioral health and PCP by [facility] prior to discharge.  See individual problem list below for more.   Problems addressed during this hospitalization Principal Problem:   Hypoglycemia Active Problems:   Mood disorder (HCC)   Avoidant-restrictive food intake disorder (ARFID)   Hypokalemia   Abdominal pain   Nausea without vomiting   Pyuria   Paresthesia of upper limb   Obesity, Class III, BMI 40-49.9 (morbid obesity) (HCC)   Persistent nausea without vomiting-suspect this to be psychogenic. Avoidant-restrictive food intake disorder -CT abdomen and pelvis and RUQ [place] without acute finding. -EGD normal.  GI signed off. -Appreciate input by psych-could be triggered by stressors.  Recommended outpatient referral for support -Started multivitamin and Remeron.  P.o. intake improved.  Maintained euglycemia of dextrose infusion. -Discharged on p.o. Remeron, multivitamin, Protonix, Reglan or Phenergan and Senokot-S. -Provided with resources for PCP, outpatient behavioral health   Hypoglycemia: Likely due to poor p.o. intake and eating disorder.  Patient is not on antihyperglycemic agent.. A1c 5.2%.  TSH, a.m. cortisol, insulin level and C-peptide within normal.  Resolved.   Hypokalemia/hypomagnesemia: Resolved.   Anxiety/depression/OCD/eating disorder: states taking Prozac at some point but she stopped -Started Remeron as above. -Outpatient follow-up as above   Abnormal LFT: Relatively stable.  HIV nonreactive. -Recheck CMP at follow-up. -Consider further work-up if it does not resolve.   Bilateral upper extremity paresthesia: Nutritional deficiency?  MRI brain and MRI  cervical spine without  acute finding. -Multivitamin as above   Pyuria/hematuria-UA with > 50 RBC, moderate Hgb, moderate LE and few bacteria.  Patient denies UTI symptoms. Urine culture with 30,000 colonies of multiple species.  No indication for treatment   Morbid obesity Body mass index is 49.8 kg/m. Nutrition Problem: Inadequate oral intake Etiology: acute illness, nausea Signs/Symptoms: per patient/family report Interventions: Refer to RD note for recommendations     Vital signs Vitals:   09/08/21 1258 09/08/21 1310 09/08/21 1401 09/09/21 0300  BP: 92/65 126/90 108/69 115/68  Pulse: (!) 107 (!) 107 (!) 105 (!) 106  Temp: (!) 97.2 F (36.2 C)  99 F (37.2 C) 98.4 F (36.9 C)  Resp: (!) 24 17 14 16   Height:      Weight:      SpO2: 100% 98% 100% 100%  TempSrc: Temporal  Axillary Oral  BMI (Calculated):         Discharge exam  GENERAL: No apparent distress.  Nontoxic. HEENT: MMM.  Vision and hearing grossly intact.  NECK: Supple.  No apparent JVD.  RESP:  No IWOB.  Fair aeration bilaterally. CVS:  RRR. Heart sounds normal.  ABD/GI/GU: BS+. Abd soft, NTND.  MSK/EXT:  Moves extremities. No apparent deformity. No edema.  SKIN: no apparent skin lesion or wound NEURO: Awake and alert. Oriented appropriately.  No apparent focal neuro deficit. PSYCH: Calm. Normal affect.   Discharge Instructions Discharge Instructions     Amb Referral to Nutrition and Diabetic Education   Complete by: As directed    Avoidant-restrictive eating disorder/disordered eating following cholecystectomy in 2020.   Diet general   Complete by: As directed    Discharge instructions   Complete by: As directed    It has been a pleasure taking care of you!  You were hospitalized due to low blood glucose, nausea and not eating well.  We have started you on medications to help with the symptoms.  We have also sent a referral to nutritionist.  You may follow-up with your primary care doctor and  psychiatrist/therapist for further assistance.  Please take your medications as prescribed.  Review your new medication list and the directions on your medications before you take them.   Take care,   Increase activity slowly   Complete by: As directed       Allergies as of 09/09/2021       Reactions   Pollen Extract Itching, Other (See Comments)   Runny nose, itchy eyes, stuffy nose   Tape Other (See Comments)   Coban wrap- Skin broke out once        Medication List     STOP taking these medications    ibuprofen 100 MG/5ML suspension Commonly known as: ADVIL   ondansetron 4 MG/5ML solution Commonly known as: ZOFRAN   potassium chloride 10 MEQ tablet Commonly known as: KLOR-CON       TAKE these medications    metoCLOPramide 5 MG tablet Commonly known as: Reglan Take 1 tablet (5 mg total) by mouth 4 (four) times daily -  before meals and at bedtime.   mirtazapine 15 MG disintegrating tablet Commonly known as: REMERON SOL-TAB Take 1 tablet (15 mg total) by mouth at bedtime.   multivitamin Liqd Take 15 mLs by mouth daily.   pantoprazole 40 MG tablet Commonly known as: Protonix Take 1 tablet (40 mg total) by mouth daily.   polyethylene glycol powder 17 GM/SCOOP powder Commonly known as: MiraLax Take 17 g by mouth 2 (two) times daily as  needed for moderate constipation.   promethazine 12.5 MG suppository Commonly known as: PHENERGAN Place 1 suppository (12.5 mg total) rectally every 8 (eight) hours as needed for up to 24 doses for nausea or vomiting. Do not take with Reglan What changed:  when to take this additional instructions   senna-docusate 8.6-50 MG tablet Commonly known as: Senokot-S Take 1 tablet by mouth 2 (two) times daily between meals as needed for moderate constipation.        Consultations: Gastroenterology Psychiatry  Procedures/Studies: 09/08/2021-EGD normal.   MR CERVICAL SPINE WO CONTRAST  Result Date: 09/04/2021 CLINICAL  DATA:  Acute myelopathy in the cervical spine. EXAM: MRI CERVICAL SPINE WITHOUT CONTRAST TECHNIQUE: Multiplanar, multisequence MR imaging of the cervical spine was performed. No intravenous contrast was administered. COMPARISON:  None Available. FINDINGS: Alignment: Straightening of the normal cervical lordosis. No listhesis. Vertebrae: No acute fracture or suspicious osseous lesion. Cord: Normal signal and morphology. Posterior Fossa, vertebral arteries, paraspinal tissues: Negative. Disc levels: C2-C3: No significant disc bulge. No spinal canal stenosis or neuroforaminal narrowing. C3-C4: No significant disc bulge. No spinal canal stenosis or neuroforaminal narrowing. C4-C5: Minimal disc bulge. No spinal canal stenosis or neural foraminal narrowing. C5-C6: No significant disc bulge. No spinal canal stenosis or neuroforaminal narrowing. C6-C7: No significant disc bulge. No spinal canal stenosis or neuroforaminal narrowing. C7-T1: No significant disc bulge. No spinal canal stenosis or neuroforaminal narrowing. IMPRESSION: No spinal canal stenosis or neural foraminal narrowing. Electronically Signed   By: Wiliam Ke M.D.   On: 09/04/2021 00:41   MR BRAIN WO CONTRAST  Result Date: 09/04/2021 CLINICAL DATA:  Stroke suspected EXAM: MRI HEAD WITHOUT CONTRAST TECHNIQUE: Multiplanar, multiecho pulse sequences of the brain and surrounding structures were obtained without intravenous contrast. COMPARISON:  No prior MRI, correlation is made with CT head 08/27/2021 FINDINGS: Brain: No restricted diffusion to suggest acute or subacute infarct. No acute hemorrhage, mass, mass effect, or midline shift. No hemosiderin deposition to suggest remote hemorrhage. No hydrocephalus or extra-axial collection. Pituitary within normal limits. Normal craniocervical junction. Vascular: Normal arterial flow voids. Skull and upper cervical spine: Normal marrow signal. Sinuses/Orbits: Mucous retention cysts in the bilateral maxillary  sinuses. The orbits are unremarkable. Other: The mastoids are well aerated. IMPRESSION: No acute intracranial process. No evidence of acute or subacute infarct. Electronically Signed   By: Wiliam Ke M.D.   On: 09/04/2021 00:40   CT ABDOMEN PELVIS W CONTRAST  Result Date: 09/03/2021 CLINICAL DATA:  Nausea/vomiting EXAM: CT ABDOMEN AND PELVIS WITH CONTRAST TECHNIQUE: Multidetector CT imaging of the abdomen and pelvis was performed using the standard protocol following bolus administration of intravenous contrast. RADIATION DOSE REDUCTION: This exam was performed according to the departmental dose-optimization program which includes automated exposure control, adjustment of the mA and/or kV according to patient size and/or use of iterative reconstruction technique. CONTRAST:  OMNIPAQUE IOHEXOL 300 MG/ML  SOLN COMPARISON:  None Available. FINDINGS: Lower chest: No acute abnormality. Hepatobiliary: No focal liver abnormality is seen. The gallbladder is unremarkable. Pancreas: Unremarkable. No pancreatic ductal dilatation or surrounding inflammatory changes. Spleen: Normal in size without focal abnormality. Adrenals/Urinary Tract: Adrenal glands are unremarkable. No hydronephrosis or nephrolithiasis. The bladder is minimally distended. Stomach/Bowel: The stomach is within normal limits. There is no evidence of bowel obstruction.The appendix is normal. Vascular/Lymphatic: No significant vascular findings are present. No enlarged abdominal or pelvic lymph nodes. Reproductive: Unremarkable. Other: No abdominal wall hernia or abnormality. No abdominopelvic ascites. Musculoskeletal: No acute or significant osseous findings. IMPRESSION:  No acute abdominopelvic abnormality. No bowel obstruction. Normal appendix. Electronically Signed   By: Caprice Renshaw M.D.   On: 09/03/2021 13:40   US Abdomen Limited RUQ (LIVER/GB)  Result Date: 09/03/2021 CLINICAL DATA:  Abnormal LFTs EXAM: ULTRASOUND ABDOMEN LIMITED RIGHT  UPPER QUADRANT COMPARISON:  04/19/2016 FINDINGS: Gallbladder: Status post cholecystectomy. No sonographic Murphy sign noted by sonographer. Common bile duct: Diameter: 0.3 cm Liver: No focal lesion identified. Within normal limits in parenchymal echogenicity. Portal vein is patent on color Doppler imaging with normal direction of blood flow towards the liver. Other: None. IMPRESSION: 1. No ultrasound abnormality of the right upper quadrant. 2. Status post cholecystectomy. Electronically Signed   By: Jearld Lesch M.D.   On: 09/03/2021 12:41   CT Head Wo Contrast  Result Date: 08/27/2021 CLINICAL DATA:  Bilateral hand numbness and nausea EXAM: CT HEAD WITHOUT CONTRAST TECHNIQUE: Contiguous axial images were obtained from the base of the skull through the vertex without intravenous contrast. RADIATION DOSE REDUCTION: This exam was performed according to the departmental dose-optimization program which includes automated exposure control, adjustment of the mA and/or kV according to patient size and/or use of iterative reconstruction technique. COMPARISON:  None Available. FINDINGS: Brain: There is no mass, hemorrhage or extra-axial collection. The size and configuration of the ventricles and extra-axial CSF spaces are normal. The brain parenchyma is normal, without acute or chronic infarction. Vascular: No abnormal hyperdensity of the major intracranial arteries or dural venous sinuses. No intracranial atherosclerosis. Skull: The visualized skull base, calvarium and extracranial soft tissues are normal. Sinuses/Orbits: No fluid levels or advanced mucosal thickening of the visualized paranasal sinuses. No mastoid or middle ear effusion. The orbits are normal. IMPRESSION: Normal head CT. Electronically Signed   By: Deatra Robinson M.D.   On: 08/27/2021 01:03       The results of significant diagnostics from this hospitalization (including imaging, microbiology, ancillary and laboratory) are listed below for  reference.     Microbiology: Recent Results (from the past 240 hour(s))  Urine Culture     Status: Abnormal   Collection Time: 09/03/21 11:19 AM   Specimen: Urine, Clean Catch  Result Value Ref Range Status   Specimen Description   Final    URINE, CLEAN CATCH Performed at [facility], [address], [place], [place] [address]    Special Requests   Final    NONE Performed at [facility], [address], [place], [place] [address]    Culture (A)  Final    30,000 COLONIES/mL MULTIPLE SPECIES PRESENT, SUGGEST RECOLLECTION   Report Status 09/05/2021 FINAL  Final     Labs:  CBC: Recent Labs  Lab 09/02/21 1324 09/03/21 0358 09/04/21 0853 09/05/21 1044 09/07/21 0815 09/09/21 0526  WBC 7.5 6.2 5.3 6.2 9.0 7.8  NEUTROABS 5.0  --  3.1 4.2  --   --   HGB 13.1 11.9* 12.1 12.1 12.3 11.6*  HCT 41.3 36.5 37.6 39.4 40.3 37.6  MCV 79.3* 79.3* 79.7* 83.7 82.4 82.6  PLT 442* 387 386 393 327 363   BMP &GFR Recent Labs  Lab 09/03/21 0358 09/04/21 0853 09/05/21 0535 09/07/21 0532 09/09/21 0526  NA 141 142 139 142 142  K 3.4* 3.9 4.5 4.0 3.7  CL 110 113* 112* 109 108  CO2 22 22 21* 24 26  GLUCOSE 115* 115* 112* 90 83  BUN <5* <5* <5* <5* <5*  CREATININE 0.79 0.78 0.65 0.64 0.78  0.79  CALCIUM 8.1* 8.7* 8.1*  8.7* 8.2*  MG 1.9 1.6* 1.8 1.7 1.7  PHOS  --  2.3* 3.0 3.7 4.1   Estimated Creatinine Clearance: 139.9 mL/min (by C-G formula based on SCr of 0.79 mg/dL). Liver & Pancreas: Recent Labs  Lab 09/02/21 1324 09/03/21 0358 09/04/21 0853 09/05/21 0535 09/07/21 0532 09/09/21 0526  AST 74* 57* 62* 57*  --   --   ALT 102* 76* 95* 86*  --   --   ALKPHOS 76 63 65 60  --   --   BILITOT 1.6* 1.1 0.9 0.9  --   --   PROT 7.9 6.1* 6.4* 5.7*  --   --   ALBUMIN 3.6 2.7* 2.8* 2.5* 2.7* 2.4*   Recent Labs  Lab 09/02/21 1324  LIPASE 22   No results for input(s): "AMMONIA" in the last 168 hours. Diabetic: No results for input(s):  "HGBA1C" in the last 72 hours. Recent Labs  Lab 09/08/21 0555 09/08/21 1139 09/08/21 1651 09/08/21 2343 09/09/21 0550  GLUCAP 100* 91 77 114* 77   Cardiac Enzymes: No results for input(s): "CKTOTAL", "CKMB", "CKMBINDEX", "TROPONINI" in the last 168 hours. No results for input(s): "PROBNP" in the last 8760 hours. Coagulation Profile: No results for input(s): "INR", "PROTIME" in the last 168 hours. Thyroid Function Tests: No results for input(s): "TSH", "T4TOTAL", "FREET4", "T3FREE", "THYROIDAB" in the last 72 hours. Lipid Profile: No results for input(s): "CHOL", "HDL", "LDLCALC", "TRIG", "CHOLHDL", "LDLDIRECT" in the last 72 hours. Anemia Panel: No results for input(s): "VITAMINB12", "FOLATE", "FERRITIN", "TIBC", "IRON", "RETICCTPCT" in the last 72 hours. Urine analysis:    Component Value Date/Time   COLORURINE RED (A) 09/02/2021 1641   APPEARANCEUR HAZY (A) 09/02/2021 1641   LABSPEC 1.014 09/02/2021 1641   PHURINE 6.0 09/02/2021 1641   GLUCOSEU >=500 (A) 09/02/2021 1641   HGBUR MODERATE (A) 09/02/2021 1641   BILIRUBINUR NEGATIVE 09/02/2021 1641   KETONESUR 80 (A) 09/02/2021 1641   PROTEINUR 100 (A) 09/02/2021 1641   NITRITE NEGATIVE 09/02/2021 1641   LEUKOCYTESUR MODERATE (A) 09/02/2021 1641   Sepsis Labs: Invalid input(s): "PROCALCITONIN", "LACTICIDVEN"   SIGNED:  Almon Hercules, MD  [facility] 09/09/2021, 12:59 PM

## 2021-09-10 ENCOUNTER — Other Ambulatory Visit: Payer: Self-pay

## 2021-09-10 ENCOUNTER — Emergency Department (HOSPITAL_BASED_OUTPATIENT_CLINIC_OR_DEPARTMENT_OTHER): Payer: Medicaid Other

## 2021-09-10 ENCOUNTER — Encounter (HOSPITAL_COMMUNITY): Payer: Self-pay | Admitting: Gastroenterology

## 2021-09-10 ENCOUNTER — Emergency Department (HOSPITAL_BASED_OUTPATIENT_CLINIC_OR_DEPARTMENT_OTHER)
Admission: EM | Admit: 2021-09-10 | Discharge: 2021-09-10 | Disposition: A | Discharge status: Home / Self Care | Payer: Medicaid Other | Attending: Emergency Medicine | Admitting: Emergency Medicine

## 2021-09-10 DIAGNOSIS — J9811 Atelectasis: Secondary | ICD-10-CM | POA: Insufficient documentation

## 2021-09-10 DIAGNOSIS — E162 Hypoglycemia, unspecified: Secondary | ICD-10-CM | POA: Diagnosis not present

## 2021-09-10 DIAGNOSIS — E876 Hypokalemia: Secondary | ICD-10-CM | POA: Insufficient documentation

## 2021-09-10 DIAGNOSIS — R110 Nausea: Secondary | ICD-10-CM | POA: Diagnosis present

## 2021-09-10 LAB — COMPREHENSIVE METABOLIC PANEL
ALT: 50 U/L — ABNORMAL HIGH (ref 0–44)
AST: 35 U/L (ref 15–41)
Albumin: 2.5 g/dL — ABNORMAL LOW (ref 3.5–5.0)
Alkaline Phosphatase: 79 U/L (ref 38–126)
Anion gap: 9 (ref 5–15)
BUN: <5 mg/dL — ABNORMAL LOW (ref 6–20)
CO2: 24 mmol/L (ref 22–32)
Calcium: 8.1 mg/dL — ABNORMAL LOW (ref 8.9–10.3)
Chloride: 104 mmol/L (ref 98–111)
Creatinine, Ser: 0.7 mg/dL (ref 0.44–1.00)
GFR, Estimated: >60 mL/min (ref >60)
Glucose, Bld: 101 mg/dL — ABNORMAL HIGH (ref 70–99)
Potassium: 3 mmol/L — ABNORMAL LOW (ref 3.5–5.1)
Sodium: 137 mmol/L (ref 135–145)
Total Bilirubin: 1.2 mg/dL (ref 0.3–1.2)
Total Protein: 6.2 g/dL — ABNORMAL LOW (ref 6.5–8.1)

## 2021-09-10 LAB — CBC
HCT: 35.9 % — ABNORMAL LOW (ref 36.0–46.0)
Hemoglobin: 11.6 g/dL — ABNORMAL LOW (ref 12.0–15.0)
MCH: 25.1 pg — ABNORMAL LOW (ref 26.0–34.0)
MCHC: 32.3 g/dL (ref 30.0–36.0)
MCV: 77.7 fL — ABNORMAL LOW (ref 80.0–100.0)
Platelets: 439 10*3/uL — ABNORMAL HIGH (ref 150–400)
RBC: 4.62 MIL/uL (ref 3.87–5.11)
RDW: 16.5 % — ABNORMAL HIGH (ref 11.5–15.5)
WBC: 7.2 10*3/uL (ref 4.0–10.5)
nRBC: 0 % (ref 0.0–0.2)

## 2021-09-10 LAB — TSH: TSH: 4.101 u[IU]/mL (ref 0.350–4.500)

## 2021-09-10 LAB — CBG MONITORING, ED
Glucose-Capillary: 73 mg/dL (ref 70–99)
Glucose-Capillary: 59 mg/dL — ABNORMAL LOW (ref 70–99)
Glucose-Capillary: 70 mg/dL (ref 70–99)

## 2021-09-10 LAB — TROPONIN I (HIGH SENSITIVITY): Troponin I (High Sensitivity): 3 ng/L (ref <18)

## 2021-09-10 LAB — MAGNESIUM: Magnesium: 1.5 mg/dL — ABNORMAL LOW (ref 1.7–2.4)

## 2021-09-10 LAB — PREGNANCY, URINE: Preg Test, Ur: NEGATIVE

## 2021-09-10 LAB — VITAMIN B12: Vitamin B-12: 424 pg/mL (ref 180–914)

## 2021-09-10 MED ORDER — SODIUM CHLORIDE 0.9 % IV BOLUS
1000.0000 mL | Freq: Once | INTRAVENOUS | Status: AC
  Administered 2021-09-10: 1000 mL via INTRAVENOUS

## 2021-09-10 MED ORDER — POTASSIUM CHLORIDE 10 MEQ/100ML IV SOLN
10.0000 meq | Freq: Once | INTRAVENOUS | Status: AC
  Administered 2021-09-10: 10 meq via INTRAVENOUS
  Filled 2021-09-10: qty 100

## 2021-09-10 MED ORDER — LORAZEPAM 2 MG/ML IJ SOLN
1.0000 mg | Freq: Once | INTRAMUSCULAR | Status: AC
  Administered 2021-09-10: 1 mg via INTRAVENOUS
  Filled 2021-09-10: qty 1

## 2021-09-10 MED ORDER — POTASSIUM BICARBONATE 25 MEQ PO TBEF
25.0000 meq | EFFERVESCENT_TABLET | Freq: Every day | ORAL | 0 refills | Status: DC
Start: 2021-09-10 — End: 2022-01-01

## 2021-09-10 MED ORDER — MAGNESIUM SULFATE 2 GM/50ML IV SOLN
2.0000 g | Freq: Once | INTRAVENOUS | Status: AC
Start: 2021-09-10 — End: 2021-09-10
  Administered 2021-09-10: 2 g via INTRAVENOUS
  Filled 2021-09-10: qty 50

## 2021-09-10 MED ORDER — POTASSIUM CHLORIDE CRYS ER 20 MEQ PO TBCR: 40.0000 meq | EXTENDED_RELEASE_TABLET | Freq: Once | ORAL | Status: DC

## 2021-09-10 MED ORDER — MAGNESIUM 200 MG PO CHEW: CHEWABLE_TABLET | ORAL | 0 refills | Status: DC

## 2021-09-10 NOTE — ED Provider Notes (Signed)
[facility] EMERGENCY DEPARTMENT Provider Note   CSN: [id number] Arrival date & time: 09/10/21  1112     History  Chief Complaint  Patient presents with   Tingling   Chest Pain    Kristen Pacheco is a 21 y.o. female.  21 year old female brought in by mom with concern for body tingling, nausea, hypoglycemia. Patient was admitted to [facility] x 1 week for hypoglycemia secondary to food avoidance, dc yesterday as she was able to maintain euglycemia off dextrose drip for 24 hours. Notes her legs were feeling numb/tingling yesterday while at [facility] and walking in the hallways, reported this to her care team and was told she was going to be discharged. Symptoms have since progressed to whole body, notes she has some pain/discomfort to her posterior thighs and if she crosses her legs she knows they are crossed but can't really feel them.        Home Medications Prior to Admission medications   Medication Sig Start Date End Date Taking? Authorizing Provider  Magnesium 200 MG CHEW Take 1 daily 09/10/21  Yes Army Melia A, PA-C  potassium bicarbonate (K-LYTE) 25 MEQ disintegrating tablet Take 1 tablet (25 mEq total) by mouth daily for 10 doses. 09/10/21 09/20/21 Yes Jeannie Fend, PA-C  metoCLOPramide (REGLAN) 5 MG tablet Take 1 tablet (5 mg total) by mouth 4 (four) times daily -  before meals and at bedtime. 09/09/21 10/09/21  Almon Hercules, MD  mirtazapine (REMERON SOL-TAB) 15 MG disintegrating tablet Take 1 tablet (15 mg total) by mouth at bedtime. 09/09/21   Almon Hercules, MD  Multiple Vitamin (MULTIVITAMIN) LIQD Take 15 mLs by mouth daily. 09/09/21 10/09/21  Almon Hercules, MD  pantoprazole (PROTONIX) 40 MG tablet Take 1 tablet (40 mg total) by mouth daily. 09/09/21 10/09/21  Almon Hercules, MD  polyethylene glycol powder (MIRALAX) 17 GM/SCOOP powder Take 17 g by mouth 2 (two) times daily as needed for moderate constipation. 09/09/21   Almon Hercules, MD  promethazine (PHENERGAN) 12.5 MG  suppository Place 1 suppository (12.5 mg total) rectally every 8 (eight) hours as needed for up to 24 doses for nausea or vomiting. Do not take with Reglan 09/09/21   Almon Hercules, MD  senna-docusate (SENOKOT-S) 8.6-50 MG tablet Take 1 tablet by mouth 2 (two) times daily between meals as needed for moderate constipation. 09/09/21   Almon Hercules, MD      Allergies    Pollen extract and Tape    Review of Systems   Review of Systems Negative except as per HPI Physical Exam Updated Vital Signs BP 128/86   Pulse 96   Temp 98.6 F (37 C) (Oral)   Resp (!) 21   LMP 09/02/2021 (Exact Date)   SpO2 96%  Physical Exam Vitals and nursing note reviewed.  Constitutional:      General: She is not in acute distress.    Appearance: She is well-developed. She is not diaphoretic.  HENT:     Head: Normocephalic and atraumatic.  Cardiovascular:     Rate and Rhythm: Normal rate and regular rhythm.     Heart sounds: Normal heart sounds.  Pulmonary:     Effort: Pulmonary effort is normal.     Breath sounds: Normal breath sounds.  Abdominal:     Palpations: Abdomen is soft.     Tenderness: There is no abdominal tenderness.  Musculoskeletal:     Right lower leg: No tenderness. No edema.  Left lower leg: No tenderness. No edema.  Skin:    General: Skin is warm and dry.     Findings: No erythema or rash.  Neurological:     Mental Status: She is alert and oriented to person, place, and time.     Deep Tendon Reflexes: Babinski sign absent on the right side. Babinski sign absent on the left side.     Reflex Scores:      Patellar reflexes are 1+ on the right side and 1+ on the left side.      Achilles reflexes are 1+ on the right side and 1+ on the left side.    Comments: Reports diminished sensation to inner lower legs, normal to outer lower legs  Psychiatric:        Behavior: Behavior normal.     ED Results / Procedures / Treatments   Labs (all labs ordered are listed, but only abnormal  results are displayed) Labs Reviewed  CBC - Abnormal; Notable for the following components:      Result Value   Hemoglobin 11.6 (*)    HCT 35.9 (*)    MCV 77.7 (*)    MCH 25.1 (*)    RDW 16.5 (*)    Platelets 439 (*)    All other components within normal limits  COMPREHENSIVE METABOLIC PANEL - Abnormal; Notable for the following components:   Potassium 3.0 (*)    Glucose, Bld 101 (*)    BUN <5 (*)    Calcium 8.1 (*)    Total Protein 6.2 (*)    Albumin 2.5 (*)    ALT 50 (*)    All other components within normal limits  MAGNESIUM - Abnormal; Notable for the following components:   Magnesium 1.5 (*)    All other components within normal limits  CBG MONITORING, ED - Abnormal; Notable for the following components:   Glucose-Capillary 59 (*)    All other components within normal limits  PREGNANCY, URINE  VITAMIN B12  TSH  CBG MONITORING, ED  CBG MONITORING, ED  TROPONIN I (HIGH SENSITIVITY)    EKG EKG Interpretation  Date/Time:  Monday September 10 2021 11:22:46 EDT Ventricular Rate:  104 PR Interval:  156 QRS Duration: 86 QT Interval:  334 QTC Calculation: 439 R Axis:   49 Text Interpretation: Sinus tachycardia Cannot rule out Inferior infarct , age undetermined Abnormal ECG When compared with ECG of 29-Aug-2021 22:37, PREVIOUS ECG IS PRESENT Confirmed by Richardean Canal [phone]) on 09/10/2021 3:45:27 PM  Radiology DG Chest 2 View  Result Date: 09/10/2021 CLINICAL DATA:  Chest pain and generalized tingling since Friday, some shortness of breath EXAM: CHEST - 2 VIEW COMPARISON:  10/28/2018 FINDINGS: Normal heart size, mediastinal contours, and pulmonary vascularity. Mild basilar hypoinflation and subsegmental atelectasis LEFT base. No infiltrate, pleural effusion, or pneumothorax. Osseous structures unremarkable. IMPRESSION: LEFT basilar atelectasis. Electronically Signed   By: Ulyses Southward M.D.   On: 09/10/2021 11:53    Procedures .Critical Care  Performed by: Jeannie Fend,  PA-C Authorized by: Jeannie Fend, PA-C   Critical care provider statement:    Critical care time (minutes):  30   Critical care was time spent personally by me on the following activities:  Development of treatment plan with patient or surrogate, discussions with consultants, evaluation of patient's response to treatment, examination of patient, ordering and review of laboratory studies, ordering and review of radiographic studies, ordering and performing treatments and interventions, pulse oximetry, re-evaluation of patient's condition  and review of old charts     Medications Ordered in ED Medications  LORazepam (ATIVAN) injection 1 mg (1 mg Intravenous Given 09/10/21 1517)  magnesium sulfate IVPB 2 g 50 mL (0 g Intravenous Stopped 09/10/21 1743)  potassium chloride 10 mEq in 100 mL IVPB (0 mEq Intravenous Stopped 09/10/21 1743)  sodium chloride 0.9 % bolus 1,000 mL (0 mLs Intravenous Stopped 09/10/21 1802)    ED Course/ Medical Decision Making/ A&P Clinical Course as of 09/10/21 1900  Mon Sep 10, 2021  1429 CBG monitoring, ED [KZ]  1430 Glucose-Capillary: 70 [KZ]  1547 Potassium(!): 3.0 [KZ]    Clinical Course User Index [KZ] Rennie Plowman, Student-PA                           Medical Decision Making Amount and/or Complexity of Data Reviewed Labs: ordered. Radiology: ordered.  Risk Prescription drug management.   This patient presents to the ED for concern of body numbness and tingling, this involves an extensive number of treatment options, and is a complaint that carries with it a high risk of complications and morbidity.  The differential diagnosis includes but not limited to electrolyte disturbance, malnutrition,   Co morbidities that complicate the patient evaluation  Avoidant directed food intake disorder, hypoglycemia, obesity   Additional history obtained:  Additional history obtained from mom at bedside with extensive knowledge and patient's medical history,  assist with history as above External records from outside source obtained and reviewed including discharge summary from patient's weeklong admission to [facility], discharged yesterday.  Regarding her upper extremity paresthesias, she had an MRI of her C-spine and brain which did not reveal any acute findings to explain patient's symptoms.  She had a CT of her abdomen pelvis which was unremarkable.  During her admission, had an endoscopy which was normal and patient was cleared by GI.  Patient was discharged with outpatient support, prescriptions for Remeron, multivitamin, Protonix, Reglan or Phenergan and a bowel regimen (Senokot S).   Lab Tests:  I Ordered, and personally interpreted labs.  The pertinent results include: CBC without significant changes from prior.  CMP with potassium 3.0, albumin is 2.5, calcium 8.1.  Mag low at 1.5.  Troponin is 3.  TSH and vitamin B12 send out and will be available for follow-up.  CBG on recheck was low at 59, patient was given Gatorade with added sugar and repeat glucose is 70.  hCG negative.   Imaging Studies ordered:  I ordered imaging studies including chest x-ray I independently visualized and interpreted imaging which showed left basilar atelectasis I agree with the radiologist interpretation   Cardiac Monitoring: / EKG:  The patient was maintained on a cardiac monitor.  I personally viewed and interpreted the cardiac monitored which showed an underlying rhythm of: Sinus tachycardia, rate 104   Consultations Obtained:  I requested consultation with the ER attending, Dr. Silverio Lay,  and discussed lab and imaging findings as well as pertinent plan - they recommend: Extensive discussion held with patient and Dr. Silverio Lay as well as mom present for conversation.  Patient does have a PCP however does not want to continue with his PCP.  Discussed process of switching PCPs however in the meantime recommend follow-up to discuss placement of port for infusions of  nutrients throughout her treatment.  Also recommend patient contact eating disorder clinic at [facility] for further treatment.   Problem List / ED Course / Critical interventions / Medication management  21 year old female with complaint of numbness as above.  Reflexes intact.  Strength symmetric in upper and lower extremities.  Found to have hypokalemia, hypomagnesemia which were replaced IV.  Patient does not tolerate IV pills easily, is discharged with oral potassium and magnesium and chewable or ODT/liquid form.  Tensive conversation held with attending physician Dr. Silverio Lay, patient and patient's mother regarding importance of follow-up with a primary care provider, consideration for a port with regularly scheduled infusions as managed by primary care provider outpatient.  Also encouraged to follow-up with [facility] eating disorder clinic. Patient was hypoglycemic with blood glucose of 59, this was replaced orally and improved on recheck. I ordered medication including IV potassium, magnesium, IV fluids, p.o. gatorade with added sugar for hypoglycemia Reevaluation of the patient after these medicines showed that the patient stayed the same I have reviewed the patients home medicines and have made adjustments as needed   Social Determinants of Health:  Actively seeking new PCP   Test / Admission - Considered:  Consider admission for nutrient replacement, levels currently at a point which can be replaced in the ER and does not require admission.          Final Clinical Impression(s) / ED Diagnoses Final diagnoses:  Hypomagnesemia  Hypokalemia  Hypoglycemia    Rx / DC Orders ED Discharge Orders          Ordered    potassium bicarbonate (K-LYTE) 25 MEQ disintegrating tablet  Daily        09/10/21 1750    Magnesium 200 MG CHEW        09/10/21 1750              Jeannie Fend, PA-C 09/10/21 1900    Charlynne Pander, MD 09/10/21 2337

## 2021-09-10 NOTE — ED Notes (Signed)
Pt complaining of burning with potassium administration. Per EDP may increase fluids to bolus to prevent from burning

## 2021-09-14 ENCOUNTER — Inpatient Hospital Stay: Payer: Medicaid Other | Admitting: Nurse Practitioner

## 2021-10-16 ENCOUNTER — Encounter: Payer: Medicaid Other | Attending: Internal Medicine | Admitting: Registered"

## 2021-10-16 ENCOUNTER — Encounter: Payer: Self-pay | Admitting: Registered"

## 2021-10-16 DIAGNOSIS — Z713 Dietary counseling and surveillance: Secondary | ICD-10-CM | POA: Diagnosis present

## 2021-10-16 NOTE — Patient Instructions (Signed)
-   Aim to midday meal such as [place] burger + jello.   - Continue to have breakfast and dinner.

## 2021-10-16 NOTE — Progress Notes (Signed)
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 States due to living locally, decided not to commute for work. States she has been unemployed for 8 months. 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   States she has a fear of nausea and vomiting and if she feels nauseous she will stop eating until she she feels safe enough to eat again. States she went 5 weeks without eating at all and starting experiencing numbness and given referral to see a dietitian.   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 States she started off inpatient and then transitioned to outpatient care. Reports she was unable to continue care after graduation due to financial challenges and lack of insurance coverage.   States due to fear of cooking, she eats out a lot. States initially she was doing research for gallbladder removal "diet" and started following that in 2018; had cholecystectomy in 2020. Reports history of dieting/restricting since 3rd grade and cholecystectomy intensified her fixation of food choices.   Reports having PCP visit yesterday and was updated that she has low Vitamin D, low Fe, prediabetes and abnormal cholesterol. Pt does not have numbers to support these right now but will bring them to next appt. States she was sad about her PCP appt yesterday and provider bringing up her weight upset her. States appt was more focused on weight rather than her  eating disorder and felt she wasn't being heard.   States she has a fear of undercooking food and knives. States she eats meals with her sister often. States she used to cook a lot during the pandemic.    Eating history: Length of time: 2020 after cholecystectomy Previous treatments: yes, [facility] Goals for RD meetings:   Weight history:  Highest weight:    Lowest weight:  Most consistent weight:   What would you like to weigh: How has weight changed in the past year:   Medical Information:  Changes in hair, skin, nails since ED started:  Chewing/swallowing difficulties:  Reflux or heartburn:  Trouble with teeth:  LMP without the use of hormones:   Weight at that point:  Effect of exercise on menses:    Effect of hormones on menses:  Constipation, diarrhea:  Dizziness/lightheadedness:  Headaches/body aches:  Heart racing/chest pain:  Mood:  Sleep:  Focus/concentration:  Cold intolerance:  Vision changes:   Mental health diagnosis: ARFID   Dietary assessment: A typical day consists of 1-2 meals and 0-1 snacks  Safe foods include: [place] burgers, pasta with [place] dressing, Chicfila, Salsarita's, KickBack Jack's, [place], chicken, fries, mashed potatoes, cherry jello, italian ices, granola bar  Avoided foods include:   24 hour recall:  B (8:30 am): granola bar + water or Chicfila - chicken minis + hash browns + water S: L: S:  D (6 pm): [employer]icfila - 4 chicken strips + fries + water or  S:  Beverages: water    What Methods Do You Use To  Control Your Weight (Compensatory behaviors)?           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  As evidenced by irrational  beliefs about the effects of food on the body.  Intervention/Goals: Pt was educated and counseled on eating to nourish the body, ways to increase nourishment and meal planning. Discussed prevalence of lactose intolerance signs/symptoms when restriction has taken place over a period of time. Discussed potentially feeling bloated, gastroparesis, abdominal distention, and feelings of fullness when increasing intake. Pt agreed with goals listed. Goals: - Aim to midday meal such as [place] burger + jello.  - Continue to have breakfast and dinner.   Meal plan:    2-3 meals    0 snacks  Monitoring and Evaluation: Patient will follow up in 4 weeks.

## 2021-11-14 ENCOUNTER — Encounter: Payer: Medicaid Other | Attending: Internal Medicine | Admitting: Registered"

## 2021-11-14 DIAGNOSIS — Z713 Dietary counseling and surveillance: Secondary | ICD-10-CM | POA: Diagnosis present

## 2021-11-14 NOTE — Progress Notes (Signed)
Appointment start time: 3:32  Appointment end time: 4:02  Patient was seen on 11/14/2021 for nutrition counseling pertaining to disordered eating  Primary care provider: Chrys Racer, DNP Therapist: none  ROI: N/A Any other medical team members: none   Assessment  Pt arrives stating she was doing well for about a week after our previous appt. States she had nausea during menstrual period and was eating a little less than normal. States she was trying to prevent restricting and having eating disorder behaviors. States then she started a new job and was not eating lunch at work due to being uncomfortable eating in new places. States she fears getting sick after eating and does not want to be at work when that happens.   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 States due to living locally, decided not to commute for work. States she has been unemployed for 8 months. 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   States she has a fear of nausea and vomiting and if she feels nauseous she will stop eating until she she feels safe enough to eat again. States she went 5 weeks without eating at all and starting experiencing numbness and given referral to see a dietitian.    Eating history: Length of time: 2020 after cholecystectomy Previous treatments: yes, [facility] Goals for RD meetings:   Weight history:  Highest weight:    Lowest weight:  Most consistent weight:   What would you like to weigh: How has weight changed in the past year:   Medical Information:  Changes in hair, skin, nails since ED started:  Chewing/swallowing difficulties:  Reflux or heartburn:  Trouble with teeth:  LMP without the use of hormones:   Weight at that point:   Effect of exercise on menses:    Effect of hormones on menses:  Constipation, diarrhea:  Dizziness/lightheadedness:  Headaches/body aches:  Heart racing/chest pain:  Mood:  Sleep:  Focus/concentration:  Cold intolerance:  Vision changes:   Mental health diagnosis: ARFID   Dietary assessment: A typical day consists of 1-2 meals and 0-1 snacks  Safe foods include: [place] burgers, pasta with [place] dressing, Chicfila, Salsarita's, Kick[employer], [place], chicken, fries, mashed potatoes, cherry jello, italian ices, granola bar  Avoided foods include:   24 hour recall:  B (8:30 am): granola bar + water or Ch[employer]fil[employer] - chicken minis + hash browns + water S: L: S:  D (6 pm): Chicfila - 4 chicken strips + fries + water or  S:  Beverages: water    What Methods Do You Use To Control Your Weight (Compensatory behaviors)?           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  As evidenced by irrational beliefs about the effects of food on the body.  Intervention/Goals: Mainly listened. Encouraged pt to have nutritional shakes or drinks for nourishment on days when she is not eating  but would rather drink.   Meal plan:    2-3 meals    0 snacks  Monitoring and Evaluation: Patient will follow up in 2 weeks.

## 2021-11-27 ENCOUNTER — Encounter: Payer: Medicaid Other | Attending: Internal Medicine | Admitting: Registered"

## 2021-11-27 DIAGNOSIS — Z713 Dietary counseling and surveillance: Secondary | ICD-10-CM | POA: Diagnosis present

## 2021-11-27 NOTE — Progress Notes (Signed)
Appointment start time: 4:08  Appointment end time: 4:55  Patient was seen on 11/27/2021 for nutrition counseling pertaining to disordered eating  Primary care provider: Chrys Racer, DNP Therapist: Mathis Dad (sees weekly; virtually)  ROI: 11/27/2021 Any other medical team members: none   Assessment  States she returned to work yesterday, 9/11. States she realizes she is is not well enough to perform a job that requires her to be on her feet and walking around for 8 hours. States some days she eats more and other days she is not eating. Reports fear of eating certain things.  States she was motivated after last appt and tried to reduce fast food consumption because she feels this is not overall beneficial for her health in the lon-run. States she tried grilled chicken nuggets; reports a lot of anxiety around eating them. Reports she has eaten them before. Had 5 nuggets + water.   States she doesn't like eating in public; did not eat yesterday at work. States she was sad and didn't want to eat. States she didn't want to find something to eat before going to work and prefers to stay asleep.   Previous appt: Pt brings in a copy of most recent lab results: Low HDL Chol (31) Elevated LDL Chol (114) Elevated A1c (5.7) Low Vitamin D (8)  States she has a fear of nausea and vomiting and if she feels nauseous she will stop eating until she she feels safe enough to eat again. States she went 5 weeks without eating at all and started experiencing numbness and given referral to see a dietitian.    Eating history: Length of time: 2020 after cholecystectomy Previous treatments: yes, [facility] Goals for RD meetings:   Weight history:  Highest weight:    Lowest weight:  Most consistent weight:   What would you like to weigh: How has weight changed in the past year:   Medical Information:  Changes in hair, skin, nails since ED started:  Chewing/swallowing difficulties:  Reflux or heartburn:   Trouble with teeth:  LMP without the use of hormones:   Weight at that point:  Effect of exercise on menses:    Effect of hormones on menses:  Constipation, diarrhea:  Dizziness/lightheadedness:  Headaches/body aches:  Heart racing/chest pain:  Mood:  Sleep:  Focus/concentration:  Cold intolerance:  Vision changes:   Mental health diagnosis: ARFID   Dietary assessment: A typical day consists of 1-2 meals and 0-1 snacks  Safe foods include: [place] burgers, pasta with [place] dressing, Chicfila, Salsarita's, Kick[employer], [place], chicken, fries, mashed potatoes, cherry jello, italian ices, granola bar  Avoided foods include:   24 hour recall:  B (8:30 am):  S: L: S:  D (6 pm):  S (10 pm): [place] ice  Beverages: water    What Methods Do You Use To Control Your Weight (Compensatory behaviors)?           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  As evidenced by irrational beliefs about the effects of food on the body.  Intervention/Goals: Mainly listened. Encouraged pt to have nutritional shakes or drinks for nourishment on days when she is not eating but would rather drink. Discussed how  to have more balance when eating out.  Goals: - Aim to have at least 3 food groups with meals: Ex. Chicfila: grilled nuggets + side salad with ranch + ranch dipping sauce + water  Meal plan:    2-3 meals    0 snacks  Monitoring and Evaluation: Patient will follow up in 2 weeks.

## 2021-11-27 NOTE — Patient Instructions (Addendum)
-   Aim to have at least 3 food groups with meals: Ex. Chicfila: grilled nuggets + side salad with ranch + ranch dipping sauce + water

## 2021-12-30 ENCOUNTER — Observation Stay (HOSPITAL_BASED_OUTPATIENT_CLINIC_OR_DEPARTMENT_OTHER)
Admission: EM | Admit: 2021-12-30 | Discharge: 2022-01-01 | Disposition: A | Discharge status: Home / Self Care | Payer: Medicaid Other | Attending: Emergency Medicine | Admitting: Emergency Medicine

## 2021-12-30 ENCOUNTER — Encounter (HOSPITAL_BASED_OUTPATIENT_CLINIC_OR_DEPARTMENT_OTHER): Payer: Self-pay

## 2021-12-30 ENCOUNTER — Other Ambulatory Visit: Payer: Self-pay

## 2021-12-30 DIAGNOSIS — D75839 Thrombocytosis, unspecified: Secondary | ICD-10-CM | POA: Diagnosis not present

## 2021-12-30 DIAGNOSIS — E876 Hypokalemia: Secondary | ICD-10-CM | POA: Diagnosis not present

## 2021-12-30 DIAGNOSIS — N179 Acute kidney failure, unspecified: Secondary | ICD-10-CM | POA: Insufficient documentation

## 2021-12-30 DIAGNOSIS — F4323 Adjustment disorder with mixed anxiety and depressed mood: Secondary | ICD-10-CM | POA: Diagnosis present

## 2021-12-30 DIAGNOSIS — Z79899 Other long term (current) drug therapy: Secondary | ICD-10-CM | POA: Diagnosis not present

## 2021-12-30 DIAGNOSIS — R110 Nausea: Secondary | ICD-10-CM | POA: Diagnosis present

## 2021-12-30 DIAGNOSIS — E860 Dehydration: Principal | ICD-10-CM | POA: Insufficient documentation

## 2021-12-30 DIAGNOSIS — R112 Nausea with vomiting, unspecified: Secondary | ICD-10-CM

## 2021-12-30 DIAGNOSIS — F5082 Avoidant/restrictive food intake disorder: Secondary | ICD-10-CM | POA: Diagnosis present

## 2021-12-30 DIAGNOSIS — Z1152 Encounter for screening for COVID-19: Secondary | ICD-10-CM | POA: Insufficient documentation

## 2021-12-30 LAB — CBC
HCT: 44.1 % (ref 36.0–46.0)
Hemoglobin: 14.6 g/dL (ref 12.0–15.0)
MCH: 24.6 pg — ABNORMAL LOW (ref 26.0–34.0)
MCHC: 33.1 g/dL (ref 30.0–36.0)
MCV: 74.2 fL — ABNORMAL LOW (ref 80.0–100.0)
Platelets: 582 10*3/uL — ABNORMAL HIGH (ref 150–400)
RBC: 5.94 MIL/uL — ABNORMAL HIGH (ref 3.87–5.11)
RDW: 18.4 % — ABNORMAL HIGH (ref 11.5–15.5)
WBC: 6.4 10*3/uL (ref 4.0–10.5)
nRBC: 0 % (ref 0.0–0.2)

## 2021-12-30 LAB — URINALYSIS, ROUTINE W REFLEX MICROSCOPIC
Glucose, UA: NEGATIVE mg/dL
Ketones, ur: >80 mg/dL — AB
Nitrite: NEGATIVE
Protein, ur: 30 mg/dL — AB
Specific Gravity, Urine: 1.023 (ref 1.005–1.030)
pH: 6 (ref 5.0–8.0)

## 2021-12-30 LAB — BASIC METABOLIC PANEL
Anion gap: 20 — ABNORMAL HIGH (ref 5–15)
BUN: 6 mg/dL (ref 6–20)
CO2: 21 mmol/L — ABNORMAL LOW (ref 22–32)
Calcium: 9.4 mg/dL (ref 8.9–10.3)
Chloride: 97 mmol/L — ABNORMAL LOW (ref 98–111)
Creatinine, Ser: 0.81 mg/dL (ref 0.44–1.00)
GFR, Estimated: >60 mL/min (ref >60)
Glucose, Bld: 79 mg/dL (ref 70–99)
Potassium: 3 mmol/L — ABNORMAL LOW (ref 3.5–5.1)
Sodium: 138 mmol/L (ref 135–145)

## 2021-12-30 LAB — COMPREHENSIVE METABOLIC PANEL
ALT: 18 U/L (ref 0–44)
AST: 25 U/L (ref 15–41)
Albumin: 4.1 g/dL (ref 3.5–5.0)
Alkaline Phosphatase: 68 U/L (ref 38–126)
Anion gap: 22 — ABNORMAL HIGH (ref 5–15)
BUN: 7 mg/dL (ref 6–20)
CO2: 20 mmol/L — ABNORMAL LOW (ref 22–32)
Calcium: 9.9 mg/dL (ref 8.9–10.3)
Chloride: 95 mmol/L — ABNORMAL LOW (ref 98–111)
Creatinine, Ser: 0.9 mg/dL (ref 0.44–1.00)
GFR, Estimated: >60 mL/min (ref >60)
Glucose, Bld: 82 mg/dL (ref 70–99)
Potassium: 3.4 mmol/L — ABNORMAL LOW (ref 3.5–5.1)
Sodium: 137 mmol/L (ref 135–145)
Total Bilirubin: 0.9 mg/dL (ref 0.3–1.2)
Total Protein: 8.4 g/dL — ABNORMAL HIGH (ref 6.5–8.1)

## 2021-12-30 LAB — PREGNANCY, URINE: Preg Test, Ur: NEGATIVE

## 2021-12-30 LAB — MAGNESIUM: Magnesium: 1.8 mg/dL (ref 1.7–2.4)

## 2021-12-30 LAB — SARS CORONAVIRUS 2 BY RT PCR: SARS Coronavirus 2 by RT PCR: NEGATIVE

## 2021-12-30 LAB — CBG MONITORING, ED: Glucose-Capillary: 78 mg/dL (ref 70–99)

## 2021-12-30 LAB — LIPASE, BLOOD: Lipase: 53 U/L — ABNORMAL HIGH (ref 11–51)

## 2021-12-30 LAB — PHOSPHORUS: Phosphorus: 3.6 mg/dL (ref 2.5–4.6)

## 2021-12-30 MED ORDER — ONDANSETRON HCL 4 MG/2ML IJ SOLN
4.0000 mg | Freq: Once | INTRAMUSCULAR | Status: AC
  Administered 2021-12-30: 4 mg via INTRAVENOUS
  Filled 2021-12-30: qty 2

## 2021-12-30 MED ORDER — DEXTROSE 10 % IV SOLN: INTRAVENOUS | Status: DC

## 2021-12-30 MED ORDER — ONDANSETRON 4 MG PO TBDP: 4.0000 mg | ORAL_TABLET | Freq: Once | ORAL | Status: DC

## 2021-12-30 MED ORDER — POTASSIUM CHLORIDE 20 MEQ PO PACK
20.0000 meq | PACK | Freq: Two times a day (BID) | ORAL | Status: DC
  Administered 2021-12-30 – 2021-12-31 (×2): 20 meq via ORAL
  Filled 2021-12-30 (×2): qty 1

## 2021-12-30 MED ORDER — METOCLOPRAMIDE HCL 5 MG/ML IJ SOLN
10.0000 mg | Freq: Once | INTRAMUSCULAR | Status: AC
  Administered 2021-12-30: 10 mg via INTRAVENOUS
  Filled 2021-12-30: qty 2

## 2021-12-30 MED ORDER — LACTATED RINGERS IV BOLUS
1000.0000 mL | Freq: Once | INTRAVENOUS | Status: AC
  Administered 2021-12-30: 1000 mL via INTRAVENOUS

## 2021-12-30 MED ORDER — POTASSIUM CHLORIDE 10 MEQ/100ML IV SOLN
10.0000 meq | INTRAVENOUS | Status: AC
  Administered 2021-12-30 – 2021-12-31 (×4): 10 meq via INTRAVENOUS
  Filled 2021-12-30 (×4): qty 100

## 2021-12-30 MED ORDER — LACTATED RINGERS IV BOLUS
1000.0000 mL | Freq: Once | INTRAVENOUS | Status: AC
Start: 2021-12-30 — End: 2021-12-30
  Administered 2021-12-30: 1000 mL via INTRAVENOUS

## 2021-12-30 NOTE — ED Provider Notes (Signed)
[facility] EMERGENCY DEPT Provider Note   CSN: [id number] Arrival date & time: 12/30/21  1247     History  Chief Complaint  Patient presents with   Nausea    Kristen Pacheco is a 21 y.o. female with h/o cholecystectomy 2020, adjustment disorder, mood disorder, obesity, eczema presents with nausea.     Endorses nausea for approximately 2 Weeks without emesis. No Fevers/chills, abdominal pain, urinary symptoms, vaginal symptoms. No Medication interventions, no medication changes. No recent travel. Endorses NB soft stools that are infrequent because she is not able to eat much and they are just soft, not watery.  Patient states she has been undergoing treatment for a chronic eating disorder (ARFID) but she is not vomiting on purpose, and she hasn't been eating because she feels so nauseated. Family at bedside corroborates this.  Has a history of cholecystectomy in 2020.  Otherwise no other abdominal surgeries.    HPI     Home Medications Prior to Admission medications   Medication Sig Start Date End Date Taking? Authorizing Provider  Magnesium 200 MG CHEW Take 1 daily 09/10/21   Jeannie Fend, PA-C  metoCLOPramide (REGLAN) 5 MG tablet Take 1 tablet (5 mg total) by mouth 4 (four) times daily -  before meals and at bedtime. 09/09/21 10/09/21  Almon Hercules, MD  mirtazapine (REMERON SOL-TAB) 15 MG disintegrating tablet Take 1 tablet (15 mg total) by mouth at bedtime. 09/09/21   Almon Hercules, MD  pantoprazole (PROTONIX) 40 MG tablet Take 1 tablet (40 mg total) by mouth daily. 09/09/21 10/09/21  Almon Hercules, MD  polyethylene glycol powder (MIRALAX) 17 GM/SCOOP powder Take 17 g by mouth 2 (two) times daily as needed for moderate constipation. 09/09/21   Almon Hercules, MD  potassium bicarbonate (K-LYTE) 25 MEQ disintegrating tablet Take 1 tablet (25 mEq total) by mouth daily for 10 doses. 09/10/21 09/20/21  Jeannie Fend, PA-C  promethazine (PHENERGAN) 12.5 MG suppository  Place 1 suppository (12.5 mg total) rectally every 8 (eight) hours as needed for up to 24 doses for nausea or vomiting. Do not take with Reglan 09/09/21   Almon Hercules, MD  senna-docusate (SENOKOT-S) 8.6-50 MG tablet Take 1 tablet by mouth 2 (two) times daily between meals as needed for moderate constipation. 09/09/21   Almon Hercules, MD      Allergies    Pollen extract and Tape    Review of Systems   Review of Systems Review of systems negative for fever/chills.  A 10 point review of systems was performed and is negative unless otherwise reported in HPI.  Physical Exam Updated Vital Signs BP 119/88 (BP Location: Left Arm)   Pulse (!) 108   Temp 98.7 F (37.1 C) (Oral)   Resp 15   Ht 5' 2.5" (1.588 m)   Wt 125.5 kg   SpO2 100%   BMI 49.80 kg/m  Physical Exam General: Normal appearing female, lying in bed.  HEENT: PERRLA, Sclera anicteric, MMM, trachea midline. Cardiology: RRR, no murmurs/rubs/gallops. BL radial and DP pulses equal bilaterally.  Resp: Normal respiratory rate and effort. CTAB, no wheezes, rhonchi, crackles.  Abd: Mild tenderness palpation in the epigastric region.  Soft, non-distended. No rebound tenderness or guarding.  GU: Deferred. MSK: No peripheral edema or signs of trauma. Extremities without deformity or TTP. No cyanosis or clubbing. Skin: warm, dry. No rashes or lesions. Back: No CVA tenderness Neuro: A&Ox4, CNs II-XII grossly intact. MAEs. Sensation grossly intact.  Psych:  Patient seems tired and uncomfortable.  She is whispering because she states that using her voice makes her more nauseated.  ED Results / Procedures / Treatments   Labs (all labs ordered are listed, but only abnormal results are displayed) Labs Reviewed  CBC - Abnormal; Notable for the following components:      Result Value   RBC 5.94 (*)    MCV 74.2 (*)    MCH 24.6 (*)    RDW 18.4 (*)    Platelets 582 (*)    All other components within normal limits  URINALYSIS, ROUTINE W  REFLEX MICROSCOPIC - Abnormal; Notable for the following components:   APPearance HAZY (*)    Hgb urine dipstick TRACE (*)    Bilirubin Urine SMALL (*)    Ketones, ur >80 (*)    Protein, ur 30 (*)    Leukocytes,Ua SMALL (*)    Bacteria, UA RARE (*)    All other components within normal limits  SARS CORONAVIRUS 2 BY RT PCR  PREGNANCY, URINE  COMPREHENSIVE METABOLIC PANEL  LIPASE, BLOOD  CBG MONITORING, ED    EKG None  Radiology No results found.  Procedures Procedures    Medications Ordered in ED Medications  ondansetron (ZOFRAN) injection 4 mg (has no administration in time range)  lactated ringers bolus 1,000 mL (has no administration in time range)    ED Course/ Medical Decision Making/ A&P                          Medical Decision Making Amount and/or Complexity of Data Reviewed Labs: ordered. Decision-making details documented in ED Course.  Risk Prescription drug management. Decision regarding hospitalization.    For patient's nausea, consider pregnancy, pancreatitis, gastritis/colitis, patient's known food avoidance.  Patient has been nauseated and not eating well for 2 weeks, she is tachycardic here into the 120s while I am in the room and with dry mucous membranes, consider electrolyte abnormalities, renal injury, severe dehydration.  Will treat with fluids.  She is afebrile, with no signs of acute abdomen, low concern for acute bacterial sepsis/bacteremia.  Patient has history of cholecystectomy several years ago, very low concern for cholelithiasis/cholecystitis.  She reports no significant abdominal pain to raise concern for SBO/ileus, IBD, diverticulitis, C. difficile, or a surgical cause of her nausea.  Labs demonstrate baseline hemoglobin 11-12, hemoglobin now 14.6 with still obvious signs of IDA with low MCV and low MCH, high RDW, patient likely volume contracted.  CMP and lipase 53.  Patient's urine demonstrates greater than 80 ketones and trace  hemoglobin but no evidence of UTI.  Will treat patient with Zofran IV, fluids, and reassess.  I have personally reviewed and interpreted all labs and imaging.   Clinical Course as of 01/25/22 1700  Sun Dec 30, 2021  1732 Lipase(!): 53 Slightly elevated but not significantly [HN]  1732 Preg Test, Ur: NEGATIVE [HN]  1735 SARS Coronavirus 2 by RT PCR: NEGATIVE [HN]  1735 Anion gap(!): 22 C/f starvation ketoacidosis. Will check Mg and Phos. [HN]  1831 Magnesium: 1.8 [HN]  1831 Glucose-Capillary: 78 [HN]  1831 Phosphorus: 3.6 wnl [HN]  1844 After zofran, patient still reporting nausea. Will try reglan IV and obtain EKG. [HN]    Clinical Course User Index [HN] Audley Hose, MD    Patient is signed out to the oncoming ED physician who is made aware of her history, presentation, exam, workup, and plan.  Plan is to continue to fluid  resuscitate and treat nausea, patient is currently going to try a PO challenge. If patient cannot orally rehydrate and tachycardia does not resolve, patient will need to be admitted.          Final Clinical Impression(s) / ED Diagnoses Final diagnoses:  Nausea    Rx / DC Orders ED Discharge Orders     None        This note was created using dictation software, which may contain spelling or grammatical errors.    Loetta Rough, MD 01/25/22 [phone]

## 2021-12-30 NOTE — ED Triage Notes (Signed)
Patient here POV from Home.  Endorses nausea for approximately 2 Weeks. No Emesis. Some Diarrhea. No Fevers. No Medication interventions. States it may be due to an Eating Disorder.   NAD Noted during Triage. A&Ox4. GCS 15. Ambulatory.

## 2021-12-30 NOTE — ED Provider Notes (Signed)
Care assumed form Dr. Mayra Neer, patient with nausea, dehydration, hypokalemia. She is getting IV fluids, potassium. She will need to pass an oral fluid challenge, or will need to be admitted.  Patient was able to take a couple sips of water without vomiting, but is not able to take sufficient quantity of fluids to continue adequate hydration at home.  Heart rate has improved with IV fluids, but heart rate jumps up to 130 with even minimal exertion.  I do not feel she is safe for discharge at this point.  It is noted that her magnesium was borderline, I have ordered some intravenous magnesium.  I have ordered additional IV fluids of normal saline.  Case is discussed with Dr. Vallarie Mare of [facility], who agrees to admit the patient.   Delora Fuel, MD 53/29/92 [phone]

## 2021-12-31 ENCOUNTER — Encounter (HOSPITAL_COMMUNITY): Payer: Self-pay

## 2021-12-31 DIAGNOSIS — E860 Dehydration: Secondary | ICD-10-CM | POA: Diagnosis not present

## 2021-12-31 DIAGNOSIS — R112 Nausea with vomiting, unspecified: Secondary | ICD-10-CM

## 2021-12-31 DIAGNOSIS — E876 Hypokalemia: Secondary | ICD-10-CM | POA: Diagnosis not present

## 2021-12-31 DIAGNOSIS — Z79899 Other long term (current) drug therapy: Secondary | ICD-10-CM | POA: Diagnosis not present

## 2021-12-31 DIAGNOSIS — N179 Acute kidney failure, unspecified: Secondary | ICD-10-CM | POA: Diagnosis not present

## 2021-12-31 DIAGNOSIS — R110 Nausea: Secondary | ICD-10-CM | POA: Diagnosis present

## 2021-12-31 DIAGNOSIS — D75839 Thrombocytosis, unspecified: Secondary | ICD-10-CM

## 2021-12-31 DIAGNOSIS — Z1152 Encounter for screening for COVID-19: Secondary | ICD-10-CM | POA: Diagnosis not present

## 2021-12-31 LAB — RAPID URINE DRUG SCREEN, HOSP PERFORMED
Amphetamines: NOT DETECTED
Barbiturates: NOT DETECTED
Benzodiazepines: NOT DETECTED
Cocaine: NOT DETECTED
Opiates: NOT DETECTED
Tetrahydrocannabinol: NOT DETECTED

## 2021-12-31 LAB — CBC
HCT: 34 % — ABNORMAL LOW (ref 36.0–46.0)
Hemoglobin: 11.1 g/dL — ABNORMAL LOW (ref 12.0–15.0)
MCH: 24.6 pg — ABNORMAL LOW (ref 26.0–34.0)
MCHC: 32.6 g/dL (ref 30.0–36.0)
MCV: 75.4 fL — ABNORMAL LOW (ref 80.0–100.0)
Platelets: 394 10*3/uL (ref 150–400)
RBC: 4.51 MIL/uL (ref 3.87–5.11)
RDW: 16.3 % — ABNORMAL HIGH (ref 11.5–15.5)
WBC: 7.2 10*3/uL (ref 4.0–10.5)
nRBC: 0 % (ref 0.0–0.2)

## 2021-12-31 LAB — BASIC METABOLIC PANEL
Anion gap: 12 (ref 5–15)
Anion gap: 10 (ref 5–15)
BUN: <5 mg/dL — ABNORMAL LOW (ref 6–20)
BUN: <5 mg/dL — ABNORMAL LOW (ref 6–20)
CO2: 24 mmol/L (ref 22–32)
CO2: 21 mmol/L — ABNORMAL LOW (ref 22–32)
Calcium: 8.6 mg/dL — ABNORMAL LOW (ref 8.9–10.3)
Calcium: 8.2 mg/dL — ABNORMAL LOW (ref 8.9–10.3)
Chloride: 100 mmol/L (ref 98–111)
Chloride: 106 mmol/L (ref 98–111)
Creatinine, Ser: 0.73 mg/dL (ref 0.44–1.00)
Creatinine, Ser: 0.84 mg/dL (ref 0.44–1.00)
GFR, Estimated: >60 mL/min (ref >60)
GFR, Estimated: >60 mL/min (ref >60)
Glucose, Bld: 89 mg/dL (ref 70–99)
Glucose, Bld: 72 mg/dL (ref 70–99)
Potassium: 3.1 mmol/L — ABNORMAL LOW (ref 3.5–5.1)
Potassium: 3.5 mmol/L (ref 3.5–5.1)
Sodium: 136 mmol/L (ref 135–145)
Sodium: 137 mmol/L (ref 135–145)

## 2021-12-31 LAB — MAGNESIUM: Magnesium: 2.3 mg/dL (ref 1.7–2.4)

## 2021-12-31 MED ORDER — METOCLOPRAMIDE HCL 5 MG/ML IJ SOLN
10.0000 mg | Freq: Once | INTRAMUSCULAR | Status: AC
  Administered 2021-12-31: 10 mg via INTRAVENOUS
  Filled 2021-12-31: qty 2

## 2021-12-31 MED ORDER — PANTOPRAZOLE SODIUM 40 MG IV SOLR
40.0000 mg | INTRAVENOUS | Status: DC
  Administered 2021-12-31: 40 mg via INTRAVENOUS
  Filled 2021-12-31 (×2): qty 10

## 2021-12-31 MED ORDER — ONDANSETRON HCL 4 MG/2ML IJ SOLN
4.0000 mg | Freq: Four times a day (QID) | INTRAMUSCULAR | Status: DC | PRN
  Administered 2021-12-31: 4 mg via INTRAVENOUS
  Filled 2021-12-31: qty 2

## 2021-12-31 MED ORDER — MIRTAZAPINE 15 MG PO TBDP
15.0000 mg | ORAL_TABLET | Freq: Every day | ORAL | Status: DC
  Administered 2021-12-31: 15 mg via ORAL
  Filled 2021-12-31 (×3): qty 1

## 2021-12-31 MED ORDER — ENOXAPARIN SODIUM 40 MG/0.4ML IJ SOSY
40.0000 mg | PREFILLED_SYRINGE | INTRAMUSCULAR | Status: DC
  Administered 2021-12-31: 40 mg via SUBCUTANEOUS
  Filled 2021-12-31: qty 0.4

## 2021-12-31 MED ORDER — MAGNESIUM SULFATE 2 GM/50ML IV SOLN
2.0000 g | Freq: Once | INTRAVENOUS | Status: AC
  Administered 2021-12-31: 2 g via INTRAVENOUS
  Filled 2021-12-31: qty 50

## 2021-12-31 MED ORDER — POTASSIUM CHLORIDE 10 MEQ/100ML IV SOLN
10.0000 meq | INTRAVENOUS | Status: AC
  Administered 2021-12-31 (×3): 10 meq via INTRAVENOUS
  Filled 2021-12-31 (×2): qty 100

## 2021-12-31 MED ORDER — ACETAMINOPHEN 325 MG PO TABS: 650.0000 mg | ORAL_TABLET | Freq: Four times a day (QID) | ORAL | Status: DC | PRN

## 2021-12-31 MED ORDER — SODIUM CHLORIDE 0.9 % IV SOLN: Freq: Once | INTRAVENOUS | Status: DC

## 2021-12-31 MED ORDER — SODIUM CHLORIDE 0.9 % IV BOLUS
1000.0000 mL | Freq: Once | INTRAVENOUS | Status: AC
  Administered 2021-12-31: 1000 mL via INTRAVENOUS

## 2021-12-31 MED ORDER — ONDANSETRON 4 MG PO TBDP
4.0000 mg | ORAL_TABLET | Freq: Three times a day (TID) | ORAL | Status: DC | PRN
  Administered 2021-12-31 – 2022-01-01 (×2): 4 mg via ORAL
  Filled 2021-12-31 (×2): qty 1

## 2021-12-31 MED ORDER — METOCLOPRAMIDE HCL 5 MG/ML IJ SOLN
5.0000 mg | Freq: Two times a day (BID) | INTRAMUSCULAR | Status: DC | PRN
  Administered 2021-12-31 – 2022-01-01 (×2): 5 mg via INTRAVENOUS
  Filled 2021-12-31 (×2): qty 2

## 2021-12-31 MED ORDER — ACETAMINOPHEN 650 MG RE SUPP: 650.0000 mg | Freq: Four times a day (QID) | RECTAL | Status: DC | PRN

## 2021-12-31 MED ORDER — SODIUM CHLORIDE 0.9 % IV SOLN: INTRAVENOUS | Status: DC

## 2021-12-31 NOTE — Assessment & Plan Note (Signed)
Likely secondary to dehydration Continue IVF and trend  

## 2021-12-31 NOTE — H&P (Signed)
History and Physical    Patient: Kristen Pacheco EXH:[id number] DOB: 02-13-2001 DOA: 12/30/2021 DOS: the patient was seen and examined on 12/31/2021 PCP: Pcp, No  Patient coming from:  [facility]  - lives with her mother    Chief Complaint: intractable nausea with no vomiting x2 weeks  HPI: Kristen Pacheco is a 21 y.o. female with medical history significant of eating disorder, depression, OCD, HLD who presented to ED with complaint of  N/V. She states about 2 weeks ago she she was eating  and started to feel funny. She had some pain in her throat and has had nausea since that time. She has had no episodes of vomiting, but she is not eating. She states she will still have some pain at times in her throat with swallowing, but this usually goes away. The zofran does help make it better. She denies any abdominal pain or diarrhea. No fevers/chills. She has had some dizziness on standing and has been more tired than normal. She denies any new medication, travel.   She states she has had issues since had her gallbladder out in May 2020.  She initially had diarrhea and started a gallbladder diet and became neurotic about this diet which led to her eating disorder. She avoids many foods because she is worried she is going to have nausea. This is still a struggle for her. It was also during covid and she didn't have regular follow up in the office.   Admitted in 08/2021 for same complaints of nausea and sore throat. CT abdomen/pelvis with no significant findings. Psychiatry and GI consulted. Underwent EGD which was normal. Started on remeron and MV.   He has been feeling good. Denies any fever/chills, vision changes/headaches, chest pain or palpitations, shortness of breath or cough, abdominal pain,  dysuria or leg swelling.    She does not smoke or drink   ER Course:  vitals: afebrile, bp: 101/71, HR: 72, RR: 16, oxygen: 99%RA Pertinent labs: UA with >80 ketones, platelets 582, potassium: 3.4>3.0>3.1,  AG  22>20>12,   In ED: given 2L boluses and IVF. Potassium/mag and started on D10 at 75cc/hour. Zofran prn. [facility] asked to admit.    Review of Systems: As mentioned in the history of present illness. All other systems reviewed and are negative. Past Medical History:  Diagnosis Date   Avoidant-restrictive food intake disorder (ARFID)    Depression    Hyperlipidemia    OCD (obsessive compulsive disorder)    Past Surgical History:  Procedure Laterality Date   AXILLARY LYMPH NODE BIOPSY     CHOLECYSTECTOMY     ESOPHAGOGASTRODUODENOSCOPY (EGD) WITH PROPOFOL N/A 09/08/2021   Procedure: ESOPHAGOGASTRODUODENOSCOPY (EGD) WITH PROPOFOL;  Surgeon: Willis Modena, MD;  Location: [facility];  Service: Gastroenterology;  Laterality: N/A;   WISDOM TOOTH EXTRACTION     Social History:  reports that she has never smoked. She has never used smokeless tobacco. She reports that she does not drink alcohol and does not use drugs.  Allergies  Allergen Reactions   Pollen Extract Itching and Other (See Comments)    Runny nose, itchy eyes, stuffy nose   Tape Other (See Comments)    Coban wrap- Skin broke out once    Family History  Problem Relation Age of Onset   Hypercholesterolemia Mother    Hypertension Mother    Hypertension Father    Hypercholesterolemia Father    Hypertension Other    Hypercholesterolemia Other    Diabetes Mellitus II Other  Prior to Admission medications   Medication Sig Start Date End Date Taking? Authorizing Provider  polyethylene glycol powder (MIRALAX) 17 GM/SCOOP powder Take 17 g by mouth 2 (two) times daily as needed for moderate constipation. 09/09/21  Yes Almon Hercules, MD  Magnesium 200 MG CHEW Take 1 daily 09/10/21   Jeannie Fend, PA-C  metoCLOPramide (REGLAN) 5 MG tablet Take 1 tablet (5 mg total) by mouth 4 (four) times daily -  before meals and at bedtime. 09/09/21 10/09/21  Almon Hercules, MD  mirtazapine (REMERON SOL-TAB) 15 MG disintegrating tablet Take 1  tablet (15 mg total) by mouth at bedtime. 09/09/21   Almon Hercules, MD  pantoprazole (PROTONIX) 40 MG tablet Take 1 tablet (40 mg total) by mouth daily. 09/09/21 10/09/21  Almon Hercules, MD  potassium bicarbonate (K-LYTE) 25 MEQ disintegrating tablet Take 1 tablet (25 mEq total) by mouth daily for 10 doses. 09/10/21 09/20/21  Jeannie Fend, PA-C  promethazine (PHENERGAN) 12.5 MG suppository Place 1 suppository (12.5 mg total) rectally every 8 (eight) hours as needed for up to 24 doses for nausea or vomiting. Do not take with Reglan 09/09/21   Almon Hercules, MD  senna-docusate (SENOKOT-S) 8.6-50 MG tablet Take 1 tablet by mouth 2 (two) times daily between meals as needed for moderate constipation. 09/09/21   Almon Hercules, MD    Physical Exam: Vitals:   12/31/21 1210 12/31/21 1417 12/31/21 1600 12/31/21 1700  BP:  103/66 118/81 120/77  Pulse:  (!) 120 94 94  Resp:  13 17 17   Temp: 98.1 F (36.7 C)  98.2 F (36.8 C) 98.2 F (36.8 C)  TempSrc: Oral  Oral Oral  SpO2:  100%    Weight:   112.7 kg 112.7 kg  Height:    5' 2.52" (1.588 m)   General:  Appears calm and comfortable and is in NAD Eyes:  PERRL, EOMI, normal lids, iris ENT:  grossly normal hearing, lips & tongue, dry mucous membranes; appropriate dentition Neck:  no LAD, masses or thyromegaly; no carotid bruits Cardiovascular:  RRR, no m/r/g. No LE edema.  Respiratory:   CTA bilaterally with no wheezes/rales/rhonchi.  Normal respiratory effort. Abdomen:  soft, NT, ND, NABS Back:   normal alignment, no CVAT Skin:  no rash or induration seen on limited exam. Normal capillary refills, no skin tenting  Musculoskeletal:  grossly normal tone BUE/BLE, good ROM, no bony abnormality Lower extremity:  No LE edema.  Limited foot exam with no ulcerations.  2+ distal pulses. Psychiatric:  grossly normal mood and affect, speech fluent and appropriate, AOx3 Neurologic:  CN 2-12 grossly intact, moves all extremities in coordinated fashion, sensation  intact   Radiological Exams on Admission: Independently reviewed - see discussion in A/P where applicable  No results found.  EKG: Independently reviewed.  Sinus tachycardia with rate 107; nonspecific ST changes with no evidence of acute ischemia   Labs on Admission: I have personally reviewed the available labs and imaging studies at the time of the admission.  Pertinent labs:   UA with >80 ketones,  platelets 582, potassium: 3.4>3.0>3.1,   AG 22>20>12,    Assessment and Plan: Principal Problem:   Dehydration secondary to intractable nausea and poor PO intake  Active Problems:   Hypokalemia   Thrombocytosis   Avoidant-restrictive food intake disorder (ARFID)   Nausea without vomiting    Assessment and Plan: * Dehydration secondary to intractable nausea and poor PO intake  21 year old female  with history of ARFID with complaints of nausea with no vomiting/diarrhea or abdominal pain x 2 weeks found to be dehydrated with intractable nausea and not eating/tachycardia and electrolyte derangement.  -obs to telemetry -continue IVF, on D10. No hypoglycemic events. Change to NS  -liquid diet  -AG closed and chloride improved to normal  -replacing potassium -requested zofran ODT and will add on IV reglan for intractable N/V. She has used this in the past -check TSH/UDS -no leukocytosis/abdominal pain/diarrhea/vomiting. No acute abdominal findings  -has ARFID which makes her avoid food and leads to dehydration, but nausea is new for her. Already in counseling/sees dietician.  -similar presentation in 08/2021 with normal CT abdomen and EGD -start back remeron, did not continue taking this after d/c    Hypokalemia Place on telemetry magnesium wnl  Secondary to poor PO intake. No exogenous losses repleted in ED, but remains low Can not tolerate any oral replacement right now Iv 50meq X3 now Trend   Thrombocytosis Likely secondary to dehydration Continue IVF and trend    Avoidant-restrictive food intake disorder (ARFID) Currently working with a dietician and counselor and trying to get back on right track  Large driving force of dehydration     Advance Care Planning:   Code Status: Full Code   Consults: none   DVT Prophylaxis: lovenox   Family Communication: mother at bedside   Severity of Illness: The appropriate patient status for this patient is OBSERVATION. Observation status is judged to be reasonable and necessary in order to provide the required intensity of service to ensure the patient's safety. The patient's presenting symptoms, physical exam findings, and initial radiographic and laboratory data in the context of their medical condition is felt to place them at decreased risk for further clinical deterioration. Furthermore, it is anticipated that the patient will be medically stable for discharge from the hospital within 2 midnights of admission.   Author: Orma Flaming, MD 12/31/2021 6:05 PM  For on call review [email or web address].

## 2021-12-31 NOTE — ED Notes (Signed)
Received report form Sandrea Hammond, EMT-P, 0.9% ND bolus & continues infusion, along with Magnesium have not be started, all are almost 2 hours overdue. No explanation as to why these have not been started for pt care. This RN to start these infusion, refer to [facility]. Otherwise, pt received, this RN to resume care at this time until 7 am or until pt is transfer. NAD noted, pt GCS 15, no needs at this time. Mother at the bedside

## 2021-12-31 NOTE — Assessment & Plan Note (Signed)
Place on telemetry magnesium wnl  Secondary to poor PO intake. No exogenous losses repleted in ED, but remains low Can not tolerate any oral replacement right now Iv 47meq X3 now Trend

## 2021-12-31 NOTE — Progress Notes (Signed)
  [facility] will assume care on arrival to accepting facility. Until arrival, care as per EDP. However, [facility] available 24/7 for questions and assistance.   Nursing staff please page [facility] Admits and Consults ([phone]) as soon as the patient arrives to the hospital.  Moorea Boissonneault, DO [facility]  

## 2021-12-31 NOTE — Assessment & Plan Note (Addendum)
21 year old female with history of ARFID with complaints of nausea with no vomiting/diarrhea or abdominal pain x 2 weeks found to be dehydrated with intractable nausea and not eating/tachycardia and electrolyte derangement.  -obs to telemetry -continue IVF, on D10. No hypoglycemic events. Change to NS  -liquid diet  -AG closed and chloride improved to normal  -replacing potassium -requested zofran ODT and will add on IV reglan for intractable N/V. She has used this in the past -check TSH/UDS -no leukocytosis/abdominal pain/diarrhea/vomiting. No acute abdominal findings  -has ARFID which makes her avoid food and leads to dehydration, but nausea is new for her. Already in counseling/sees dietician.  -similar presentation in 08/2021 with normal CT abdomen and EGD -start back remeron, did not continue taking this after d/c

## 2021-12-31 NOTE — ED Notes (Addendum)
Pt was given strawberry [place] ice and water. Pt refused to attempt to try eating or drinking. Pt's mother at bedside was encouraging Pt to try taking little sips of water and eat some of the icee. Pt's mother came out of Pt's room and stated Pt did finally take some sips of water and eat some of the [place] ice. Pt did not report any nausea after getting something into her stomach.

## 2021-12-31 NOTE — ED Notes (Signed)
Provider notified that pt is requesting additional antinausea medication, awaiting response, refer to [facility]

## 2021-12-31 NOTE — Assessment & Plan Note (Signed)
Currently working with a dietician and counselor and trying to get back on right track  Large driving force of dehydration

## 2021-12-31 NOTE — Progress Notes (Signed)
At bedside to assess for a PIV. Pt noted to have very poor vasculature for PIV. Obtained 1 PIV at 1-1.5 cm in depth. Inserted a 22G 2.5 cm catheter with success. Good blood return noted. All other vessels approximately 3 and greater in depth. Recommend a CL/PICC for future access needs.

## 2021-12-31 NOTE — Progress Notes (Signed)
Plan of Care Note for accepted transfer   Patient: Kristen Pacheco MRN: [id number]   DOA: 12/30/2021  Facility requesting transfer: [facility]  Patient accepted last night by Dr. Bridgett Larsson for electrolyte derangement in setting of eating d/o and N/V. Received call from nurse asking about medication change. Discussed I can not place orders until I have phsycially seen and examined patient and she is still under care of emergency room physician. He voiced his understanding and will discuss with EdP   Author: Orma Flaming, MD 12/31/2021  Check [email or web address] for on-call coverage.  Nursing staff, Please call Centre number on Amion as soon as patient's arrival, so appropriate admitting provider can evaluate the pt.

## 2021-12-31 NOTE — ED Notes (Signed)
Water given to pt 

## 2022-01-01 ENCOUNTER — Encounter: Payer: Self-pay | Admitting: Family Medicine

## 2022-01-01 DIAGNOSIS — E860 Dehydration: Secondary | ICD-10-CM

## 2022-01-01 LAB — BASIC METABOLIC PANEL
Anion gap: 12 (ref 5–15)
BUN: <5 mg/dL — ABNORMAL LOW (ref 6–20)
CO2: 20 mmol/L — ABNORMAL LOW (ref 22–32)
Calcium: 8.8 mg/dL — ABNORMAL LOW (ref 8.9–10.3)
Chloride: 107 mmol/L (ref 98–111)
Creatinine, Ser: 0.8 mg/dL (ref 0.44–1.00)
GFR, Estimated: >60 mL/min (ref >60)
Glucose, Bld: 69 mg/dL — ABNORMAL LOW (ref 70–99)
Potassium: 4 mmol/L (ref 3.5–5.1)
Sodium: 139 mmol/L (ref 135–145)

## 2022-01-01 LAB — CBC
HCT: 32.1 % — ABNORMAL LOW (ref 36.0–46.0)
Hemoglobin: 11.1 g/dL — ABNORMAL LOW (ref 12.0–15.0)
MCH: 25.6 pg — ABNORMAL LOW (ref 26.0–34.0)
MCHC: 34.6 g/dL (ref 30.0–36.0)
MCV: 74 fL — ABNORMAL LOW (ref 80.0–100.0)
Platelets: 369 10*3/uL (ref 150–400)
RBC: 4.34 MIL/uL (ref 3.87–5.11)
RDW: 16.3 % — ABNORMAL HIGH (ref 11.5–15.5)
WBC: 5.8 10*3/uL (ref 4.0–10.5)
nRBC: 0 % (ref 0.0–0.2)

## 2022-01-01 MED ORDER — METOCLOPRAMIDE HCL 5 MG PO TABS: 5.0000 mg | ORAL_TABLET | Freq: Three times a day (TID) | ORAL | 0 refills | Status: AC

## 2022-01-01 MED ORDER — METOCLOPRAMIDE HCL 5 MG PO TABS: 5.0000 mg | ORAL_TABLET | Freq: Three times a day (TID) | ORAL | Status: DC

## 2022-01-01 MED ORDER — SODIUM CHLORIDE 0.9 % IV BOLUS
1000.0000 mL | Freq: Once | INTRAVENOUS | Status: AC
  Administered 2022-01-01: 1000 mL via INTRAVENOUS

## 2022-01-01 MED ORDER — ONDANSETRON 4 MG PO TBDP: 4.0000 mg | ORAL_TABLET | Freq: Three times a day (TID) | ORAL | 0 refills | Status: DC | PRN

## 2022-01-01 NOTE — Progress Notes (Signed)
DISCHARGE NOTE HOME Kristen Pacheco to be discharged Home per MD order. Discussed prescriptions and follow up appointments with the patient. Prescriptions given to patient; medication list explained in detail. Patient verbalized understanding.  Skin clean, dry and intact without evidence of skin break down, no evidence of skin tears noted. IV catheter discontinued intact. Site without signs and symptoms of complications. Dressing and pressure applied. Pt denies pain at the site currently. No complaints noted.  Patient free of lines, drains, and wounds.   An After Visit Summary (AVS) was printed and given to the patient. Patient escorted via wheelchair, and discharged home via private auto.  Treon Kehl S Nitara Szczerba, RN

## 2022-01-01 NOTE — TOC Initial Note (Signed)
Transition of Care [facility]) - Initial/Assessment Note    Patient Details  Name: Kristen Pacheco MRN: [id number] Date of Birth: [birth year]  [facility] of [facility]) CM/SW Contact:    Ninfa Meeker, RN Phone Number: 01/01/2022, 9:53 AM  Clinical Narrative:                 Transition of Care screening Note  Transition of Care Department [facility]) has reviewed patient and no TOC needs have been identified at this time. We will continue to monitor patient advancement through Interdisciplinary progressions. If new patient transition needs arise, please place a consult.           Patient Goals and CMS Choice        Expected Discharge Plan and Services                                                Prior Living Arrangements/Services                       Activities of Daily Living Home Assistive Devices/Equipment: None ADL Screening (condition at time of admission) Patient's cognitive ability adequate to safely complete daily activities?: Yes Is the patient deaf or have difficulty hearing?: No Does the patient have difficulty seeing, even when wearing glasses/contacts?: No Does the patient have difficulty concentrating, remembering, or making decisions?: No Patient able to express need for assistance with ADLs?: Yes Does the patient have difficulty dressing or bathing?: No Independently performs ADLs?: Yes (appropriate for developmental age) Does the patient have difficulty walking or climbing stairs?: No Weakness of Legs: None Weakness of Arms/Hands: None  Permission Sought/Granted                  Emotional Assessment              Admission diagnosis:  Dehydration [E86.0] Nausea [R11.0] Patient Active Problem List   Diagnosis Date Noted   Dehydration secondary to intractable nausea and poor PO intake  12/31/2021   Thrombocytosis 12/31/2021   Obesity, Class III, BMI 40-49.9 (morbid obesity) ([employer]) 09/06/2021   Pyuria 09/05/2021    Paresthesia of upper limb 09/05/2021   Hypoglycemia 09/02/2021   Hypokalemia 09/02/2021   Abdominal pain 09/02/2021   Nausea without vomiting 09/02/2021   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 [address] [place] [address] Phone: [phone] Fax: [phone]     Social Determinants of Health (SDOH) Interventions    Readmission Risk Interventions     No data to display

## 2022-01-01 NOTE — Progress Notes (Signed)
Initial Nutrition Assessment  DOCUMENTATION CODES:   Morbid obesity  INTERVENTION:  - No interventions at this time.    NUTRITION DIAGNOSIS:   Altered GI function related to vomiting, nausea as evidenced by energy intake < or equal to 50% for > or equal to 5 days.  GOAL:   Patient will meet greater than or equal to 90% of their needs  MONITOR:   PO intake  REASON FOR ASSESSMENT:   Malnutrition Screening Tool    ASSESSMENT:   21 y.o. female admits related to intractable nausea x2 weeks. PMH includes: eating disorder, depression, OCD, and HLD.  Meds include:  reglan, remeron. Labs reviewed.   The pt reports that she was not eating well PTA for about 2 weeks due to nausea. She states that she was able to eat a roll, green beans and some breakfast potatoes today and keep it down. Pt planning to discharge today. Per record, pt has experienced an 11% wt loss in 4 months, which is significant. Pt denies any kind of nutritional supplement at this time.   NUTRITION - FOCUSED PHYSICAL EXAM:  Flowsheet Row Most Recent Value  Orbital Region No depletion  Upper Arm Region No depletion  Thoracic and Lumbar Region No depletion  Buccal Region No depletion  Temple Region No depletion  Clavicle Bone Region No depletion  Clavicle and Acromion Bone Region No depletion  Scapular Bone Region No depletion  Dorsal Hand No depletion  Patellar Region No depletion  Anterior Thigh Region No depletion  Edema (RD Assessment) None  Hair Reviewed  Eyes Reviewed  Mouth Reviewed  Skin Reviewed  Nails Reviewed       Diet Order:   Diet Order             Diet regular Room service appropriate? Yes; Fluid consistency: Thin  Diet effective now           Diet - low sodium heart healthy                   EDUCATION NEEDS:   Not appropriate for education at this time  Skin:  Skin Assessment: Reviewed RN Assessment  Last BM:  Unknown  Height:   Ht Readings from Last 1  Encounters:  12/31/21 5' 2.52" (1.588 m)    Weight:   Wt Readings from Last 1 Encounters:  12/31/21 112.7 kg    Ideal Body Weight:  51.1 kg  BMI:  Body mass index is 44.69 kg/m.  Estimated Nutritional Needs:   Kcal:  1610-9604 kcals  Protein:  110-140 gm  Fluid:  5409-8119 mL  Thalia Bloodgood, RD, LDN, CNSC

## 2022-01-01 NOTE — Discharge Summary (Signed)
Physician Discharge Summary  Kristen Pacheco MRN:[id number] DOB: 02/24/2001 DOA: 12/30/2021  PCP: Pcp, No  Admit date: 12/30/2021 Discharge date: 01/01/2022  Time spent: 24 minutes  Recommendations for Outpatient Follow-up:  Needs CBC Chem-7 in about a week Prescription given for Zofran as well as Reglan on discharge  Discharge Diagnoses:  MAIN problem for hospitalization   Mild AKI present on admission with ketonuria Nausea vomiting causing AKI BMI >40-morbid obesity Underlying eating disorder  Please see below for itemized issues addressed in [facility]psital- refer to other progress notes for clarity if needed  Discharge Condition: Improved  Diet recommendation: Heart healthy  Filed Weights   12/30/21 1253 12/31/21 1600 12/31/21 1700  Weight: 125.5 kg 112.7 kg 112.7 kg    History of present illness:  21 black female home dwelling known history eating disorder getting counseling Depression, BMI 44 OCD HLD Cholecystectomy 2020 with resulting diarrhea causing her eating disorder Admitted 6/18 through 09/09/2021-had persistent nausea without vomiting and avoidant restrictive food intake disorder-CT abdomen pelvis, RUQ US, EGD all normal felt to be triggered by psychiatric stressors discharged on Remeron Reglan etc.   Found to have hypokalemia mild metabolic acidosis, mild ketonuria--- patient's lab work resolved some during hospital stay she was initially liquid diet placed on IV fluids and she started to tolerate p.o. fairly well She was able to graduate diet and eat food on the second day of being in the hospital and it was felt that patient was stable and could continue cautious graduation of diet in the outpatient setting and follow-up in the outpatient setting with regular physician as well as her psychiatrist Met maximum benefit from hospital stay and was discharged in stable state  Discharge Exam: Vitals:   01/01/22 0357 01/01/22 0914  BP: (!) 82/45 116/84  Pulse: 91 96   Resp: 18 16  Temp: (!) 97.5 F (36.4 C) 97.7 F (36.5 C)  SpO2: 100% 100%    Subj on day of d/c   Awake coherent no distress no belly pain passing gas passing stool some nausea no vomiting  General Exam on discharge  Thick neck Mallampati 4 no icterus no pallor Chest clear no added sound no rales no rhonchi no wheeze Abdomen obese nontender slight distention No lower extremity edema Moving 4 limbs equally grossly intact neurologically  Discharge Instructions   Discharge Instructions     Diet - low sodium heart healthy   Complete by: As directed    Discharge instructions   Complete by: As directed    Please use Zofran as well as Reglan for nausea vomiting symptoms and seek medical attention if you have more than 2 or 3 episodes over 24-hour period of time Continue to eat diet and follow-up with your psychiatrist for your other concerns and issues   Increase activity slowly   Complete by: As directed       Allergies as of 01/01/2022       Reactions   Pollen Extract Itching, Other (See Comments)   Runny nose, itchy eyes, stuffy nose   Tape Other (See Comments)   Coban wrap- Skin broke out once        Medication List     STOP taking these medications    Magnesium 200 MG Chew   mirtazapine 15 MG disintegrating tablet Commonly known as: REMERON SOL-TAB   potassium bicarbonate 25 MEQ disintegrating tablet Commonly known as: K-LYTE   promethazine 12.5 MG suppository Commonly known as: PHENERGAN   senna-docusate 8.6-50 MG tablet Commonly known   as: Senokot-S       TAKE these medications    metoCLOPramide 5 MG tablet Commonly known as: Reglan Take 1 tablet (5 mg total) by mouth 4 (four) times daily -  before meals and at bedtime.   ondansetron 4 MG disintegrating tablet Commonly known as: ZOFRAN-ODT Take 1 tablet (4 mg total) by mouth every 8 (eight) hours as needed for nausea or vomiting.   pantoprazole 40 MG tablet Commonly known as:  Protonix Take 1 tablet (40 mg total) by mouth daily.   polyethylene glycol powder 17 GM/SCOOP powder Commonly known as: MiraLax Take 17 g by mouth 2 (two) times daily as needed for moderate constipation.       Allergies  Allergen Reactions   Pollen Extract Itching and Other (See Comments)    Runny nose, itchy eyes, stuffy nose   Tape Other (See Comments)    Coban wrap- Skin broke out once      The results of significant diagnostics from this hospitalization (including imaging, microbiology, ancillary and laboratory) are listed below for reference.    Significant Diagnostic Studies: No results found.  Microbiology: Recent Results (from the past 240 hour(s))  SARS Coronavirus 2 by RT PCR (hospital order, performed in [facility] hospital lab) *cepheid single result test* Anterior Nasal Swab     Status: None   Collection Time: 12/30/21  4:40 PM   Specimen: Anterior Nasal Swab  Result Value Ref Range Status   SARS Coronavirus 2 by RT PCR NEGATIVE NEGATIVE Final    Comment: (NOTE) SARS-CoV-2 target nucleic acids are NOT DETECTED.  The SARS-CoV-2 RNA is generally detectable in upper and lower respiratory specimens during the acute phase of infection. The lowest concentration of SARS-CoV-2 viral copies this assay can detect is 250 copies / mL. A negative result does not preclude SARS-CoV-2 infection and should not be used as the sole basis for treatment or other patient management decisions.  A negative result may occur with improper specimen collection / handling, submission of specimen other than nasopharyngeal swab, presence of viral mutation(s) within the areas targeted by this assay, and inadequate number of viral copies (<250 copies / mL). A negative result must be combined with clinical observations, patient history, and epidemiological information.  Fact Sheet for Patients:   https://www.patel.info/  Fact Sheet for Healthcare  Providers: https://hall.com/  This test is not yet approved or  cleared by the [place] FDA and has been authorized for detection and/or diagnosis of SARS-CoV-2 by FDA under an Emergency Use Authorization (EUA).  This EUA will remain in effect (meaning this test can be used) for the duration of the COVID-19 declaration under Section 564(b)(1) of the Act, 21 U.S.C. section 360bbb-3(b)(1), unless the authorization is terminated or revoked sooner.  Performed at [facility], [address], [place], [place] [address]      Labs: Basic Metabolic Panel: Recent Labs  Lab 12/30/21 1630 12/30/21 2040 12/31/21 0728 12/31/21 2139 01/01/22 0325  NA 137 138 136 137 139  K 3.4* 3.0* 3.1* 3.5 4.0  CL 95* 97* 100 106 107  CO2 20* 21* 24 21* 20*  GLUCOSE 82 79 89 72 69*  BUN 7 6 <5* <5* <5*  CREATININE 0.90 0.81 0.73 0.84 0.80  CALCIUM 9.9 9.4 8.6* 8.2* 8.8*  MG 1.8  --  2.3  --   --   PHOS 3.6  --   --   --   --    Liver Function Tests: Recent Labs  Lab 12/30/21 1630  AST 25  ALT 18  ALKPHOS 68  BILITOT 0.9  PROT 8.4*  ALBUMIN 4.1   Recent Labs  Lab 12/30/21 1630  LIPASE 53*   No results for input(s): "AMMONIA" in the last 168 hours. CBC: Recent Labs  Lab 12/30/21 1258 12/31/21 2139 01/01/22 0325  WBC 6.4 7.2 5.8  HGB 14.6 11.1* 11.1*  HCT 44.1 34.0* 32.1*  MCV 74.2* 75.4* 74.0*  PLT 582* 394 369   Cardiac Enzymes: No results for input(s): "CKTOTAL", "CKMB", "CKMBINDEX", "TROPONINI" in the last 168 hours. BNP: BNP (last 3 results) No results for input(s): "BNP" in the last 8760 hours.  ProBNP (last 3 results) No results for input(s): "PROBNP" in the last 8760 hours.  CBG: Recent Labs  Lab 12/30/21 1603  GLUCAP 78       Signed:  Jai-Gurmukh Samtani MD   [facility] 01/01/2022, 4:13 PM   

## 2022-01-02 ENCOUNTER — Ambulatory Visit: Payer: Medicaid Other | Admitting: Registered"

## 2022-01-19 ENCOUNTER — Other Ambulatory Visit: Payer: Self-pay

## 2022-01-19 ENCOUNTER — Emergency Department (HOSPITAL_BASED_OUTPATIENT_CLINIC_OR_DEPARTMENT_OTHER)
Admission: EM | Admit: 2022-01-19 | Discharge: 2022-01-20 | Disposition: A | Discharge status: Home / Self Care | Payer: Medicaid Other | Attending: Emergency Medicine | Admitting: Emergency Medicine

## 2022-01-19 ENCOUNTER — Emergency Department (HOSPITAL_BASED_OUTPATIENT_CLINIC_OR_DEPARTMENT_OTHER): Payer: Medicaid Other

## 2022-01-19 DIAGNOSIS — R1084 Generalized abdominal pain: Secondary | ICD-10-CM | POA: Insufficient documentation

## 2022-01-19 DIAGNOSIS — R3915 Urgency of urination: Secondary | ICD-10-CM | POA: Diagnosis not present

## 2022-01-19 DIAGNOSIS — R197 Diarrhea, unspecified: Secondary | ICD-10-CM | POA: Insufficient documentation

## 2022-01-19 DIAGNOSIS — R112 Nausea with vomiting, unspecified: Secondary | ICD-10-CM | POA: Insufficient documentation

## 2022-01-19 DIAGNOSIS — R195 Other fecal abnormalities: Secondary | ICD-10-CM | POA: Insufficient documentation

## 2022-01-19 DIAGNOSIS — R350 Frequency of micturition: Secondary | ICD-10-CM | POA: Diagnosis not present

## 2022-01-19 DIAGNOSIS — R5383 Other fatigue: Secondary | ICD-10-CM | POA: Diagnosis not present

## 2022-01-19 DIAGNOSIS — Z20822 Contact with and (suspected) exposure to covid-19: Secondary | ICD-10-CM | POA: Insufficient documentation

## 2022-01-19 DIAGNOSIS — R519 Headache, unspecified: Secondary | ICD-10-CM | POA: Insufficient documentation

## 2022-01-19 LAB — URINALYSIS, ROUTINE W REFLEX MICROSCOPIC
Bilirubin Urine: NEGATIVE
Glucose, UA: NEGATIVE mg/dL
Hgb urine dipstick: NEGATIVE
Ketones, ur: 15 mg/dL — AB
Leukocytes,Ua: NEGATIVE
Nitrite: NEGATIVE
Protein, ur: NEGATIVE mg/dL
Specific Gravity, Urine: 1.01 (ref 1.005–1.030)
pH: 6 (ref 5.0–8.0)

## 2022-01-19 LAB — RESP PANEL BY RT-PCR (FLU A&B, COVID) ARPGX2
Influenza A by PCR: NEGATIVE
Influenza B by PCR: NEGATIVE
SARS Coronavirus 2 by RT PCR: NEGATIVE

## 2022-01-19 LAB — CBC WITH DIFFERENTIAL/PLATELET
Abs Immature Granulocytes: 0.02 10*3/uL (ref 0.00–0.07)
Basophils Absolute: 0 10*3/uL (ref 0.0–0.1)
Basophils Relative: 0 %
Eosinophils Absolute: 0.1 10*3/uL (ref 0.0–0.5)
Eosinophils Relative: 1 %
HCT: 34.3 % — ABNORMAL LOW (ref 36.0–46.0)
Hemoglobin: 10.9 g/dL — ABNORMAL LOW (ref 12.0–15.0)
Immature Granulocytes: 0 %
Lymphocytes Relative: 34 %
Lymphs Abs: 2.4 10*3/uL (ref 0.7–4.0)
MCH: 24.3 pg — ABNORMAL LOW (ref 26.0–34.0)
MCHC: 31.8 g/dL (ref 30.0–36.0)
MCV: 76.4 fL — ABNORMAL LOW (ref 80.0–100.0)
Monocytes Absolute: 0.5 10*3/uL (ref 0.1–1.0)
Monocytes Relative: 7 %
Neutro Abs: 4.1 10*3/uL (ref 1.7–7.7)
Neutrophils Relative %: 58 %
Platelets: 473 10*3/uL — ABNORMAL HIGH (ref 150–400)
RBC: 4.49 MIL/uL (ref 3.87–5.11)
RDW: 16.5 % — ABNORMAL HIGH (ref 11.5–15.5)
WBC: 7.2 10*3/uL (ref 4.0–10.5)
nRBC: 0 % (ref 0.0–0.2)

## 2022-01-19 LAB — PREGNANCY, URINE: Preg Test, Ur: NEGATIVE

## 2022-01-19 MED ORDER — ONDANSETRON HCL 4 MG/2ML IJ SOLN: 4.0000 mg | Freq: Once | INTRAMUSCULAR | Status: DC

## 2022-01-19 MED ORDER — SODIUM CHLORIDE 0.9 % IV BOLUS: 1000.0000 mL | Freq: Once | INTRAVENOUS | Status: DC

## 2022-01-19 MED ORDER — ONDANSETRON 4 MG PO TBDP
4.0000 mg | ORAL_TABLET | Freq: Once | ORAL | Status: AC
  Administered 2022-01-19: 4 mg via ORAL
  Filled 2022-01-19: qty 1

## 2022-01-19 MED ORDER — SODIUM CHLORIDE 0.9 % IV BOLUS
1000.0000 mL | Freq: Once | INTRAVENOUS | Status: AC
  Administered 2022-01-19: 1000 mL via INTRAVENOUS

## 2022-01-19 MED ORDER — LOPERAMIDE HCL 2 MG PO CAPS
4.0000 mg | ORAL_CAPSULE | Freq: Once | ORAL | Status: AC
  Administered 2022-01-19: 4 mg via ORAL
  Filled 2022-01-19: qty 2

## 2022-01-19 MED ORDER — ONDANSETRON HCL 4 MG/2ML IJ SOLN
4.0000 mg | Freq: Once | INTRAMUSCULAR | Status: AC
  Administered 2022-01-19: 4 mg via INTRAVENOUS
  Filled 2022-01-19: qty 2

## 2022-01-19 NOTE — ED Provider Notes (Signed)
  Provider Note MRN:  [id number]  Arrival date & time: 01/20/22    ED Course and Medical Decision Making  Assumed care from Dr. Melina Copa at shift change.  Nausea vomiting diarrhea, feels dehydrated, awaiting labs, providing fluids.  146am update:  feeling better, labs reassuring, soft abdomen on repeat exam, no emergent process, discharge.  Ultrasound ED Peripheral IV (Provider)  Date/Time: 01/19/2022 11:44 PM  Performed by: Maudie Flakes, MD Authorized by: Maudie Flakes, MD   Procedure details:    Indications: multiple failed IV attempts     Skin Prep: chlorhexidine gluconate     Location:  Left AC   Angiocath:  20 G   Bedside Ultrasound Guided: Yes     Patient tolerated procedure without complications: Yes     Dressing applied: Yes     Final Clinical Impressions(s) / ED Diagnoses     ICD-10-CM   1. Nausea vomiting and diarrhea  R11.2    R19.7       ED Discharge Orders          Ordered    ondansetron (ZOFRAN-ODT) 4 MG disintegrating tablet  Every 8 hours PRN        01/20/22 0146    loperamide (IMODIUM) 2 MG capsule  4 times daily PRN        01/20/22 0146              Discharge Instructions      You were evaluated in the Emergency Department and after careful evaluation, we did not find any emergent condition requiring admission or further testing in the hospital.  Your exam/testing today was overall reassuring.  Suspect continued symptoms from viral illness.  Zofran for nausea, imodium for diarrhea.    Please return to the Emergency Department if you experience any worsening of your condition.  Thank you for allowing [place] to be a part of your care.       Barth Kirks. Sedonia Small, [place] [email or web address]    Maudie Flakes, MD 01/20/22 [phone]

## 2022-01-19 NOTE — ED Notes (Signed)
Ultrasound guided IV access was attempted 2x by PA. Unable to obtain IV access.

## 2022-01-19 NOTE — ED Notes (Signed)
[place] IV attempted x2, communicated to ED PA that 3 RN attempted access without success. ED PA to attempt. [place] at bedside

## 2022-01-19 NOTE — ED Provider Notes (Signed)
[place] EMERGENCY DEPT Provider Note   CSN: [id number] Arrival date & time: 01/19/22  1830     History  Chief Complaint  Patient presents with   Abdominal Pain   Diarrhea    Kristen Pacheco is a 21 y.o. female, who presents to the ED secondary to intermittent diarrhea and nausea for the last week.  She states that she ranges from having only 1 episode of diarrhea or loose stools to 10 a day.  States has been going on for the last week, endorses generalized fatigue, and headache.  States that today she has some diffuse abdominal pain, but rates it a 6 out of 10 and feels achy and uncomfortable.  Denies any blood in the stools, black stools.  Endorses urinary symptoms including increased frequency and urgency. Reports orange stools. Denies any sick contacts.     Home Medications Prior to Admission medications   Medication Sig Start Date End Date Taking? Authorizing Provider  metoCLOPramide (REGLAN) 5 MG tablet Take 1 tablet (5 mg total) by mouth 4 (four) times daily -  before meals and at bedtime. 01/01/22 01/31/22  Nita Sells, MD  ondansetron (ZOFRAN-ODT) 4 MG disintegrating tablet Take 1 tablet (4 mg total) by mouth every 8 (eight) hours as needed for nausea or vomiting. 01/01/22   Nita Sells, MD  pantoprazole (PROTONIX) 40 MG tablet Take 1 tablet (40 mg total) by mouth daily. 09/09/21 12/31/21  Mercy Riding, MD  polyethylene glycol powder (MIRALAX) 17 GM/SCOOP powder Take 17 g by mouth 2 (two) times daily as needed for moderate constipation. 09/09/21   Mercy Riding, MD      Allergies    Pollen extract and Tape    Review of Systems   Review of Systems  Gastrointestinal:  Positive for abdominal pain and diarrhea.    Physical Exam Updated Vital Signs BP 134/71   Pulse 78   Temp 97.8 F (36.6 C)   Resp 18   Ht 5\' 3"  (1.6 m)   Wt 112.7 kg   SpO2 100%   BMI 44.01 kg/m  Physical Exam Vitals and nursing note reviewed.  Constitutional:       General: She is not in acute distress.    Appearance: She is well-developed.  HENT:     Head: Normocephalic and atraumatic.  Eyes:     Conjunctiva/sclera: Conjunctivae normal.  Cardiovascular:     Rate and Rhythm: Normal rate and regular rhythm.     Heart sounds: No murmur heard. Pulmonary:     Effort: Pulmonary effort is normal. No respiratory distress.     Breath sounds: Normal breath sounds.  Abdominal:     Palpations: Abdomen is soft.     Tenderness: There is generalized abdominal tenderness. There is no right CVA tenderness, left CVA tenderness, guarding or rebound.  Musculoskeletal:        General: No swelling.     Cervical back: Neck supple.  Skin:    General: Skin is warm and dry.     Capillary Refill: Capillary refill takes less than 2 seconds.  Neurological:     Mental Status: She is alert.  Psychiatric:        Mood and Affect: Mood normal.     ED Results / Procedures / Treatments   Labs (all labs ordered are listed, but only abnormal results are displayed) Labs Reviewed  URINALYSIS, ROUTINE W REFLEX MICROSCOPIC - Abnormal; Notable for the following components:      Result Value  Ketones, ur 15 (*)    All other components within normal limits  RESP PANEL BY RT-PCR (FLU A&B, COVID) ARPGX2  PREGNANCY, URINE  COMPREHENSIVE METABOLIC PANEL  LIPASE, BLOOD  CBC WITH DIFFERENTIAL/PLATELET    EKG None  Radiology DG Abdomen 1 View  Result Date: 01/19/2022 CLINICAL DATA:  Headache with diarrhea and nausea. EXAM: ABDOMEN - 1 VIEW COMPARISON:  None Available. FINDINGS: The bowel gas pattern is normal. No radio-opaque calculi or other significant radiographic abnormality are seen. IMPRESSION: Negative. Electronically Signed   By: Aram Candela M.D.   On: 01/19/2022 22:11    Procedures Procedures   Medications Ordered in ED Medications  loperamide (IMODIUM) capsule 4 mg (4 mg Oral Given 01/19/22 2122)  ondansetron (ZOFRAN-ODT) disintegrating tablet 4 mg  (4 mg Oral Given 01/19/22 2121)    ED Course/ Medical Decision Making/ A&P                           Medical Decision Making Patient is a 21 year old female, here for planned stools, diarrhea for the last week.  Also feeling poor.  Reports also generalized abdominal pain has been having severe nausea and feels like unable to eat or drink.  And KUB, COVID/flu and blood work for further evaluation.  Attempted IV twice personally, and entire nursing staff has also attempted  Signed out to Dr. Charm Barges pending IV placement and labs.  Amount and/or Complexity of Data Reviewed Labs: ordered. Radiology: ordered.  Risk Prescription drug management.   Final Clinical Impression(s) / ED Diagnoses Final diagnoses:  None    Rx / DC Orders ED Discharge Orders     None         Amneet Cendejas, [place], PA 01/19/22 2233    Terrilee Files, MD 01/20/22 [phone]

## 2022-01-19 NOTE — ED Triage Notes (Signed)
Patient arrives with complaints of headache, diarrhea, and nausea x3 days.  Patient also report new onset abdominal pain.   Rates abdominal pain 6/10

## 2022-01-20 LAB — COMPREHENSIVE METABOLIC PANEL
ALT: 10 U/L (ref 0–44)
AST: 14 U/L — ABNORMAL LOW (ref 15–41)
Albumin: 3.5 g/dL (ref 3.5–5.0)
Alkaline Phosphatase: 62 U/L (ref 38–126)
Anion gap: 10 (ref 5–15)
BUN: <5 mg/dL — ABNORMAL LOW (ref 6–20)
CO2: 28 mmol/L (ref 22–32)
Calcium: 8.7 mg/dL — ABNORMAL LOW (ref 8.9–10.3)
Chloride: 101 mmol/L (ref 98–111)
Creatinine, Ser: 0.82 mg/dL (ref 0.44–1.00)
GFR, Estimated: >60 mL/min (ref >60)
Glucose, Bld: 69 mg/dL — ABNORMAL LOW (ref 70–99)
Potassium: 3.4 mmol/L — ABNORMAL LOW (ref 3.5–5.1)
Sodium: 139 mmol/L (ref 135–145)
Total Bilirubin: 0.9 mg/dL (ref 0.3–1.2)
Total Protein: 6.9 g/dL (ref 6.5–8.1)

## 2022-01-20 LAB — LIPASE, BLOOD: Lipase: <10 U/L — ABNORMAL LOW (ref 11–51)

## 2022-01-20 MED ORDER — ONDANSETRON 4 MG PO TBDP: 4.0000 mg | ORAL_TABLET | Freq: Three times a day (TID) | ORAL | 0 refills | Status: AC | PRN

## 2022-01-20 MED ORDER — LOPERAMIDE HCL 2 MG PO CAPS: 2.0000 mg | ORAL_CAPSULE | Freq: Four times a day (QID) | ORAL | 0 refills | Status: AC | PRN

## 2022-01-20 NOTE — Discharge Instructions (Signed)
You were evaluated in the Emergency Department and after careful evaluation, we did not find any emergent condition requiring admission or further testing in the hospital.  Your exam/testing today was overall reassuring.  Suspect continued symptoms from viral illness.  Zofran for nausea, imodium for diarrhea.    Please return to the Emergency Department if you experience any worsening of your condition.  Thank you for allowing [place] to be a part of your care.

## 2022-01-23 ENCOUNTER — Encounter: Payer: Medicaid Other | Attending: Internal Medicine | Admitting: Registered"

## 2022-01-23 ENCOUNTER — Encounter: Payer: Self-pay | Admitting: Registered"

## 2022-01-23 DIAGNOSIS — Z713 Dietary counseling and surveillance: Secondary | ICD-10-CM | POA: Diagnosis not present

## 2022-01-23 NOTE — Progress Notes (Signed)
This visit was completed virtually due to the COVID-19 pandemic.   I spoke with Kristen Pacheco and verified that I was speaking with the correct person with two patient identifiers (full name and date of birth).   I discussed the limitations related to this kind of visit and the patient is willing to proceed.  Appointment start time: 5:04  Appointment end time: 5:37  Patient was seen on 01/23/2022 for nutrition counseling pertaining to disordered eating  Primary care provider: Chrys Racer, DNP Therapist: Mathis Dad (sees weekly; virtually)  ROI: 11/27/2021 Any other medical team members: none   Assessment  States she gets a 60 min lunch break daily. States she was doing well eating lunch at work and the last 2 weeks things took a turn for the worst and stopped eating at work. States she was in hospital on 10/17. States 2 weeks after getting out of the hospital was doing well. Then she started having diarrhea, nausea, and went to ED. States she had a viral infection. Reports fear of eating food at work because she may have diarrhea and not be able to get to restroom in time. Reports she has not been eating breakfast because she has not been in the mood for breakfast foods lately and although she likes them, does not know how her body will respond to them. States she had [place] sandwich last night and did not have diarrhea afterwards.    States she started a new job at [employer]. States she works Mon-Fri 8-5 pm.    Previous appt: Pt brings in a copy of most recent lab results: Low HDL Chol (31) Elevated LDL Chol (114) Elevated A1c (5.7) Low Vitamin D (8)  States she has a fear of nausea and vomiting and if she feels nauseous she will stop eating until she she feels safe enough to eat again. States she went 5 weeks without eating at all and started experiencing numbness and given referral to see a dietitian.    Eating history: Length of time: 2020 after  cholecystectomy Previous treatments: yes, [facility] Goals for RD meetings:   Weight history:  Highest weight:    Lowest weight:  Most consistent weight:   What would you like to weigh: How has weight changed in the past year:   Medical Information:  Changes in hair, skin, nails since ED started: no Chewing/swallowing difficulties: no Reflux or heartburn: yes Trouble with teeth: no LMP without the use of hormones: 10/24 Constipation, diarrhea: no, not in the last 3-4 days Dizziness/lightheadedness: a little, happens daily  Headaches/body aches: yes, headaches, having them 4 out of 7 days/week; body aches are present and constant  Heart racing/chest pain: no Mood: energy not as high as she would like Sleep: 5-7 hrs/night Focus/concentration: some challenges Cold intolerance: no Vision changes: no  Mental health diagnosis: ARFID   Dietary assessment: A typical day consists of 1-2 meals and 0-1 snacks  Safe foods include: [place] burgers, pasta with [place] dressing, Chicfila, Salsarita's, Kick[employer] Jack's, [place], chicken, fries, mashed potatoes, cherry jello, italian ices, granola bar  Avoided foods include:   24 hour recall:  B (8:30 am):  S: L: S:  D (6 pm): Chicfila - grilled nuggets or sandwich ([place], a little mayo) + water S (10 pm):   Beverages: water (2*16.9 oz; 33 oz)    What Methods Do You Use To Control Your Weight (Compensatory behaviors)?           Restricting   SIV  Diet pills  Laxatives  Diuretics  Alcohol or drugs  Exercise (what type)  Food rules or rituals (explain)  Binge  Estimated energy intake: 300-400 kcal  Estimated energy needs: 2000-2200 kcal 250-275 g CHO 150-165 g pro 44-49 g fat  Nutrition Diagnosis: NB-1.5 Disordered eating pattern As related to ARFID.  As evidenced by irrational beliefs about the effects of food on the body.  Intervention/Goals: Mainly listened. Encouraged pt to have food items that are safe for her  in the morning before work that does not necessarily have to be breakfast food. Discussed increasing fluid intake and to do that. Pt agreed with goals.  Goals: - Aim to have [place] sandwich in the morning as first meal.  - Increase water intake to 3 bottles a day.   Meal plan:    2-3 meals    0 snacks  Monitoring and Evaluation: Patient will follow up in 5 weeks.

## 2022-01-23 NOTE — Patient Instructions (Addendum)
-   Aim to have [place] sandwich in the morning as first meal.   - Increase water intake to 3 bottles a day.

## 2022-02-26 ENCOUNTER — Encounter: Payer: Self-pay | Admitting: Registered"

## 2022-02-26 ENCOUNTER — Encounter: Payer: Medicaid Other | Attending: Internal Medicine | Admitting: Registered"

## 2022-02-26 ENCOUNTER — Ambulatory Visit: Payer: Medicaid Other | Admitting: Registered"

## 2022-02-26 DIAGNOSIS — Z713 Dietary counseling and surveillance: Secondary | ICD-10-CM | POA: Insufficient documentation

## 2022-02-26 NOTE — Patient Instructions (Signed)
-   Continue to aim for at least 3 bottles (48 oz) of water a day.   - Continue having food in the mornings and in evenings.

## 2022-02-26 NOTE — Progress Notes (Signed)
This visit was completed virtually due to the COVID-19 pandemic.   I spoke with Kristen Pacheco and verified that I was speaking with the correct person with two patient identifiers (full name and date of birth).   I discussed the limitations related to this kind of visit and the patient is willing to proceed.  Appointment start time: 5:04  Appointment end time: 5:33  Patient was seen on 02/26/2022 for nutrition counseling pertaining to disordered eating  Primary care provider: Chrys Racer, DNP Therapist: Mathis Dad (sees weekly; virtually)  ROI: 11/27/2021 Any other medical team members: none   Assessment  States she has a respiratory infection and currently taking antibiotics. States work is going ok. States Thanksgiving was ok. States she had to do a lot of "crafting" and ate mostly vegetables and bread as her meal. States she ate green beans, corn, candied yams, cranberry, and bread. Reports all went well and did not have any GI challenges. States she has been eating a lot of bread and sticking with chicken and [place] as protein options. States since being sick she hasn't had much of an appetite. States she has been trying to keep up with water and hydration mostly.   States it has been challenging timing antibiotics 12 hours apart and making sure she has food on her stomach each time.   Overall reports keeping food on her stomach and not vomiting.   Previous appt: States she had [place] sandwich last night and did not have diarrhea afterwards. Pt brings in a copy of most recent lab results: Low HDL Chol (31) Elevated LDL Chol (114) Elevated A1c (5.7) Low Vitamin D (8)  States she has a fear of nausea and vomiting and if she feels nauseous she will stop eating until she she feels safe enough to eat again. States she went 5 weeks without eating at all and started experiencing numbness and given referral to see a dietitian.   States she started a new job at [employer];  works Mon-Fri 8-5 pm.   Eating history: Length of time: 2020 after cholecystectomy Previous treatments: yes, [facility] Goals for RD meetings:   Weight history:  Highest weight:    Lowest weight:  Most consistent weight:   What would you like to weigh: How has weight changed in the past year:   Medical Information:  Changes in hair, skin, nails since ED started: no Chewing/swallowing difficulties: no Reflux or heartburn: no; has improved, taking pantoprazole Trouble with teeth: no LMP without the use of hormones: 10/24 Constipation, diarrhea: no, not in the last 3-4 days Dizziness/lightheadedness: a little, happens daily  Headaches/body aches: yes, headaches, having them 4 out of 7 days/week; body aches are present and constant  Heart racing/chest pain: no Mood: energy not as high as she would like Sleep: 6-7 hrs/night Focus/concentration: no, has improved Cold intolerance: no Vision changes: no  Mental health diagnosis: ARFID   Dietary assessment: A typical day consists of 1-2 meals and 0-1 snacks  Safe foods include: [place] burgers, pasta with [place] dressing, [employer]fila, Salsarita's, [employer], [place], chicken, fries, mashed potatoes, cherry jello, italian ices, granola bar  Avoided foods include:   24 hour recall:  B (8:30 am): 2 brown and serve rolls S: L:  S:  D (6 pm): [employer] - grilled chicken sandwich (with regular bun, lettuce, cheese, mayo) + 3/4 slice of cheesecake (salted caramel) S (10 pm):   Beverages: water (48+ oz)    What Methods Do You Use To  Control Your Weight (Compensatory behaviors)?           Restricting   SIV  Diet pills  Laxatives  Diuretics  Alcohol or drugs  Exercise (what type)  Food rules or rituals (explain)  Binge  Estimated energy intake: 1600-1700 kcal  Estimated energy needs: 2000-2200 kcal 250-275 g CHO 150-165 g pro 44-49 g fat  Nutrition Diagnosis: NB-1.5 Disordered eating pattern As related to  ARFID.  As evidenced by irrational beliefs about the effects of food on the body.  Intervention/Goals: Mainly listened. Encouraged pt to have food items that are safe for her in the morning before work and in the evenings. Pt agreed with goals.  Goals: - Continue to aim for at least 3 bottles (48 oz) of water a day.  - Continue having food in the mornings and in evenings.   Meal plan:    2-3 meals    0 snacks  Monitoring and Evaluation: Patient will follow up in 5 weeks.

## 2022-04-02 ENCOUNTER — Telehealth: Payer: Medicaid Other | Admitting: Registered"

## 2022-04-04 ENCOUNTER — Ambulatory Visit (HOSPITAL_COMMUNITY)
Admission: EM | Admit: 2022-04-04 | Discharge: 2022-04-05 | Disposition: A | Discharge status: Home / Self Care | Payer: Medicaid Other | Attending: Nurse Practitioner | Admitting: Nurse Practitioner

## 2022-04-04 ENCOUNTER — Emergency Department (HOSPITAL_BASED_OUTPATIENT_CLINIC_OR_DEPARTMENT_OTHER): Payer: Medicaid Other | Admitting: Radiology

## 2022-04-04 ENCOUNTER — Emergency Department (HOSPITAL_BASED_OUTPATIENT_CLINIC_OR_DEPARTMENT_OTHER): Payer: Medicaid Other

## 2022-04-04 ENCOUNTER — Encounter (HOSPITAL_BASED_OUTPATIENT_CLINIC_OR_DEPARTMENT_OTHER): Payer: Self-pay

## 2022-04-04 ENCOUNTER — Emergency Department (HOSPITAL_BASED_OUTPATIENT_CLINIC_OR_DEPARTMENT_OTHER)
Admission: EM | Admit: 2022-04-04 | Discharge: 2022-04-04 | Disposition: A | Discharge status: Home / Self Care | Payer: Medicaid Other | Attending: Emergency Medicine | Admitting: Emergency Medicine

## 2022-04-04 ENCOUNTER — Other Ambulatory Visit: Payer: Self-pay

## 2022-04-04 ENCOUNTER — Emergency Department (HOSPITAL_COMMUNITY): Payer: Medicaid Other

## 2022-04-04 DIAGNOSIS — S8001XA Contusion of right knee, initial encounter: Secondary | ICD-10-CM | POA: Insufficient documentation

## 2022-04-04 DIAGNOSIS — F429 Obsessive-compulsive disorder, unspecified: Secondary | ICD-10-CM | POA: Insufficient documentation

## 2022-04-04 DIAGNOSIS — F411 Generalized anxiety disorder: Secondary | ICD-10-CM | POA: Insufficient documentation

## 2022-04-04 DIAGNOSIS — Y92410 Unspecified street and highway as the place of occurrence of the external cause: Secondary | ICD-10-CM | POA: Insufficient documentation

## 2022-04-04 DIAGNOSIS — S0990XA Unspecified injury of head, initial encounter: Secondary | ICD-10-CM

## 2022-04-04 DIAGNOSIS — F4320 Adjustment disorder, unspecified: Secondary | ICD-10-CM | POA: Insufficient documentation

## 2022-04-04 DIAGNOSIS — M25561 Pain in right knee: Secondary | ICD-10-CM | POA: Diagnosis not present

## 2022-04-04 DIAGNOSIS — R519 Headache, unspecified: Secondary | ICD-10-CM | POA: Insufficient documentation

## 2022-04-04 DIAGNOSIS — F32A Depression, unspecified: Secondary | ICD-10-CM | POA: Insufficient documentation

## 2022-04-04 DIAGNOSIS — F509 Eating disorder, unspecified: Secondary | ICD-10-CM | POA: Insufficient documentation

## 2022-04-04 DIAGNOSIS — S8991XA Unspecified injury of right lower leg, initial encounter: Secondary | ICD-10-CM | POA: Diagnosis present

## 2022-04-04 DIAGNOSIS — F4329 Adjustment disorder with other symptoms: Secondary | ICD-10-CM

## 2022-04-04 MED ORDER — ONDANSETRON 4 MG PO TBDP
4.0000 mg | ORAL_TABLET | Freq: Once | ORAL | Status: AC
  Administered 2022-04-04: 4 mg via ORAL
  Filled 2022-04-04: qty 1
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
## 2022-04-04 NOTE — ED Notes (Signed)
Pt verbalized understanding of d/c instructions, meds, and followup care. Denies questions. VSS, no distress noted. Steady gait to exit with all belongings.  ?

## 2022-04-05 NOTE — Discharge Instructions (Signed)
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
## 2022-04-17 ENCOUNTER — Encounter: Payer: Self-pay | Admitting: Registered"

## 2022-04-17 ENCOUNTER — Encounter: Payer: Medicaid Other | Attending: Internal Medicine | Admitting: Registered"

## 2022-04-17 DIAGNOSIS — Z713 Dietary counseling and surveillance: Secondary | ICD-10-CM | POA: Insufficient documentation

## 2022-04-17 NOTE — Patient Instructions (Signed)
-  Continue to have 2 meals with at least 3 food groups daily.   - Aim to at least 3 times/day.   - Keep up the great work with adequate fluid intake and trying new foods!
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
## 2022-05-07 ENCOUNTER — Encounter: Payer: Medicaid Other | Attending: Internal Medicine | Admitting: Registered"

## 2022-05-07 ENCOUNTER — Encounter: Payer: Self-pay | Admitting: Registered"

## 2022-05-07 DIAGNOSIS — Z713 Dietary counseling and surveillance: Secondary | ICD-10-CM | POA: Insufficient documentation

## 2022-05-07 NOTE — Patient Instructions (Addendum)
-   Take snacks to work starting next week:  Goldfish  Granola bars  PB crackers Fruit snacks  - Keep up the great work!
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
## 2022-05-09 ENCOUNTER — Ambulatory Visit: Payer: Medicaid Other

## 2022-05-16 ENCOUNTER — Ambulatory Visit: Payer: Medicaid Other | Admitting: Physical Therapy

## 2022-05-27 ENCOUNTER — Other Ambulatory Visit: Payer: Self-pay

## 2022-05-27 ENCOUNTER — Ambulatory Visit: Payer: Medicaid Other | Attending: Orthopedic Surgery

## 2022-05-27 DIAGNOSIS — G8929 Other chronic pain: Secondary | ICD-10-CM | POA: Diagnosis present

## 2022-05-27 DIAGNOSIS — M6281 Muscle weakness (generalized): Secondary | ICD-10-CM | POA: Insufficient documentation

## 2022-05-27 DIAGNOSIS — M25561 Pain in right knee: Secondary | ICD-10-CM | POA: Diagnosis present

## 2022-05-27 DIAGNOSIS — R2689 Other abnormalities of gait and mobility: Secondary | ICD-10-CM | POA: Diagnosis present
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
## 2022-05-29 ENCOUNTER — Telehealth: Payer: Medicaid Other | Admitting: Registered"
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
## 2022-06-06 ENCOUNTER — Ambulatory Visit: Payer: Medicaid Other

## 2022-06-06 DIAGNOSIS — R2689 Other abnormalities of gait and mobility: Secondary | ICD-10-CM

## 2022-06-06 DIAGNOSIS — M25561 Pain in right knee: Secondary | ICD-10-CM | POA: Diagnosis not present

## 2022-06-06 DIAGNOSIS — G8929 Other chronic pain: Secondary | ICD-10-CM

## 2022-06-06 DIAGNOSIS — M6281 Muscle weakness (generalized): Secondary | ICD-10-CM

## 2022-06-06 NOTE — Therapy (Signed)
OUTPATIENT PHYSICAL THERAPY TREATMENT NOTE   Patient Name: Kristen Pacheco MRN: [id number] DOB:[birth year], 22 y.o., female Today's Date: 06/06/2022  PCP: No PCP  REFERRING PROVIDER: Renette Butters, MD   END OF SESSION:   PT End of Session - 06/06/22 1752     Visit Number 2    Number of Visits 17    Date for PT Re-Evaluation 07/23/22    Authorization Type [place] [facility]    PT Start Time 1752   arrived late   PT Stop Time 1830    PT Time Calculation (min) 38 min    Activity Tolerance Patient tolerated treatment well    Behavior During Therapy WFL for tasks assessed/performed             Past Medical History:  Diagnosis Date   Avoidant-restrictive food intake disorder (ARFID)    Depression    Hyperlipidemia    OCD (obsessive compulsive disorder)    Past Surgical History:  Procedure Laterality Date   AXILLARY LYMPH NODE BIOPSY     CHOLECYSTECTOMY     ESOPHAGOGASTRODUODENOSCOPY (EGD) WITH PROPOFOL N/A 09/08/2021   Procedure: ESOPHAGOGASTRODUODENOSCOPY (EGD) WITH PROPOFOL;  Surgeon: Arta Silence, MD;  [facility];  Service: Gastroenterology;  Laterality: N/A;   WISDOM TOOTH EXTRACTION     Patient Active Problem List   Diagnosis Date Noted   Dehydration secondary to intractable nausea and poor PO intake  12/31/2021   Thrombocytosis 12/31/2021   Obesity, Class III, BMI 40-49.9 (morbid obesity) ([place]) 09/06/2021   Pyuria 09/05/2021   Paresthesia of upper limb 09/05/2021   Hypoglycemia 09/02/2021   Hypokalemia 09/02/2021   Abdominal pain 09/02/2021   Nausea without vomiting 09/02/2021   Avoidant-restrictive food intake disorder (ARFID) 10/13/2019   Mild episode of recurrent major depressive disorder ([place]) 10/13/2019   Mood disorder ([place]) 03/06/2017   Intrinsic eczema 10/11/2015   Seasonal allergic rhinitis due to pollen 08/05/2015    REFERRING DIAG: M25.569 (ICD-10-CM) - Anterior knee pain   THERAPY DIAG:  Chronic pain of right knee  Muscle weakness  (generalized)  Other abnormalities of gait and mobility  Rationale for Evaluation and Treatment Rehabilitation  PERTINENT HISTORY: None   PRECAUTIONS: None   SUBJECTIVE:                                                                                                                                                                                      SUBJECTIVE STATEMENT:  Pt presents to PT with no reports of continued pain in knees. Has been compliant with HEP with no adverse effect.    PAIN:  Are you having pain?  Yes: NPRS scale: 5/10 Worst: 9/10  Pain location: bilateral knee R>L Pain description: sharp  Aggravating factors: prolonged standing, driving, stairs Relieving factors: medication,    OBJECTIVE: (objective measures completed at initial evaluation unless otherwise dated)  DIAGNOSTIC FINDINGS:             N/A   PATIENT SURVEYS:  FOTO: 41% function; 59% predicted    COGNITION: Overall cognitive status: Within functional limits for tasks assessed                         SENSATION: WFL     POSTURE: rounded shoulders, forward head, and genu valgus   PALPATION: TTP to medial R knee joint line, painful patellar glides R    LOWER EXTREMITY ROM:   Active ROM Right eval Left eval  Hip flexion      Hip extension      Hip abduction      Hip adduction      Hip internal rotation      Hip external rotation      Knee flexion WNL WNL  Knee extension WNL WNL  Ankle dorsiflexion      Ankle plantarflexion      Ankle inversion      Ankle eversion       (Blank rows = not tested)   LOWER EXTREMITY MMT:   MMT Right eval Left eval  Hip flexion      Hip extension      Hip abduction 3+/5 3+/5  Hip adduction      Hip internal rotation      Hip external rotation      Knee flexion 4/5 4/5  Knee extension 3+/5 4/5  Ankle dorsiflexion      Ankle plantarflexion      Ankle inversion      Ankle eversion       (Blank rows = not tested)   LOWER EXTREMITY  SPECIAL TESTS:  Knee special tests: Lachman Test: negative   FUNCTIONAL TESTS:  30 Second Sit to Stand: 10 reps with pain   GAIT: Distance walked: 7ft Assistive device utilized: None Level of assistance: Complete Independence Comments: antalgic gait R, bilateral out-toeing      TREATMENT: [facility] Adult PT Treatment:                                                DATE: 06/06/2022 Therapeutic Exercise: Rec bike lvl 2.0 x 3 min while taking subjective Quad sets x 10 - 5" hold Supine SLR 2x15 each S/L clamshell 2x15 BTB Bridge 2x10 Lateral walk YTB x 3 laps at counter Standing hip abd/ext 2x10 YTB TKE 2x10 each  S/L hip abd x 5 Seated figure 4 stretch x 30" each  [facility] Adult PT Treatment:                                                DATE: 05/28/2022 Therapeutic Exercise: Quad sets x 10 - 5" hold Supine SLR x 5 each S/L hip abd x 5 Seated figure 4 stretch x 30" each   PATIENT EDUCATION:  Education details: eval findings, FOTO, HEP, POC Person educated: Patient Education method: Explanation, Demonstration, and Handouts Education comprehension: verbalized understanding and returned demonstration  HOME EXERCISE PROGRAM: Access Code: [email or web address] URL: [email or web address] Date: 06/06/2022 Prepared by: Octavio Manns  Exercises - Supine Quadricep Sets  - 1 x daily - 7 x weekly - 2 sets - 10 reps - 5 sec hold - Active Straight Leg Raise with Quad Set  - 1 x daily - 7 x weekly - 2 sets - 10 reps - Sidelying Hip Abduction  - 1 x daily - 7 x weekly - 2 sets - 10 reps - Seated Piriformis Stretch with Trunk Bend  - 1 x daily - 7 x weekly - 2 sets - 30 sec hold - Side Stepping with Resistance at Ankles and Counter Support  - 1 x daily - 7 x weekly - 3 reps - yellow band hold - Standing Hip Abduction with Resistance at Ankles and Counter Support  - 1 x daily - 7 x weekly - 2 sets - 10 reps - yellow back hold   ASSESSMENT:   CLINICAL IMPRESSION: Pt was able to complete  all prescribed exercises with no adverse effect or increase in pain. Therapy focused on improving quad and proximal hip strength in order to decrease pain and improve comfort. HEP updated for continued strengthening. Will continue per POC as prescribed   OBJECTIVE IMPAIRMENTS: decreased activity tolerance, decreased mobility, difficulty walking, decreased strength, and pain.    ACTIVITY LIMITATIONS: lifting, standing, squatting, stairs, and transfers   PARTICIPATION LIMITATIONS: driving, shopping, community activity, occupation, and yard work   PERSONAL FACTORS: Time since onset of injury/illness/exacerbation are also affecting patient's functional outcome.      GOALS: Goals reviewed with patient? No   SHORT TERM GOALS: Target date: 06/17/2022   Pt will be compliant and knowledgeable with initial HEP for improved comfort and carryover Baseline: initial HEP given  Goal status: INITIAL   2.  Pt will self report bilateral knee pain no greater than 6/10 for improved comfort and functional ability Baseline: 9/10 at worst Goal status: INITIAL      LONG TERM GOALS: Target date: 07/22/2022   Pt will improve FOTO function score to no less than 59% as proxy for functional improvement Baseline: 41% function Goal status: INITIAL    2.  Pt will self report bilateral knee pain no greater than 3/10 for improved comfort and functional ability Baseline: 9/10 at worst Goal status: INITIAL    3.  Pt will increase 30 Second Sit to Stand rep count to no less than 12 reps for improved balance, strength, and functional mobility Baseline: 10 reps  Goal status: INITIAL    4.  Pt will be able to ambulate up/down a flight of stairs with no increase in bilateral knee pain and reciprocal gait for improved comfort with home and community activities  Baseline: unable Goal status: INITIAL     PLAN:   PT FREQUENCY: 2x/week   PT DURATION: 8 weeks   PLANNED INTERVENTIONS: Therapeutic exercises,  Therapeutic activity, Neuromuscular re-education, Balance training, Gait training, Patient/Family education, Self Care, Joint mobilization, Aquatic Therapy, Dry Needling, Electrical stimulation, Vasopneumatic device, Manual therapy, and Re-evaluation   PLAN FOR NEXT SESSION: progress quad and proximal hip strength   Ward Chatters, PT 06/06/2022, 6:33 PM

## 2022-06-10 ENCOUNTER — Ambulatory Visit: Payer: Medicaid Other

## 2022-06-10 DIAGNOSIS — R2689 Other abnormalities of gait and mobility: Secondary | ICD-10-CM

## 2022-06-10 DIAGNOSIS — M25561 Pain in right knee: Secondary | ICD-10-CM | POA: Diagnosis not present

## 2022-06-10 DIAGNOSIS — M6281 Muscle weakness (generalized): Secondary | ICD-10-CM

## 2022-06-10 DIAGNOSIS — G8929 Other chronic pain: Secondary | ICD-10-CM

## 2022-06-10 NOTE — Therapy (Unsigned)
OUTPATIENT PHYSICAL THERAPY TREATMENT NOTE   Patient Name: Kristen Pacheco MRN: [id number] DOB:2000-05-13, 22 y.o., female Today's Date: 06/12/2022  PCP: No PCP  REFERRING PROVIDER: Sheral Apley, MD   END OF SESSION:   PT End of Session - 06/12/22 1000     Visit Number 4    Number of Visits 17    Date for PT Re-Evaluation 07/23/22    Authorization Type [place] [facility]    PT Start Time 1000    PT Stop Time 1040    PT Time Calculation (min) 40 min    Activity Tolerance Patient tolerated treatment well    Behavior During Therapy WFL for tasks assessed/performed              Past Medical History:  Diagnosis Date   Avoidant-restrictive food intake disorder (ARFID)    Depression    Hyperlipidemia    OCD (obsessive compulsive disorder)    Past Surgical History:  Procedure Laterality Date   AXILLARY LYMPH NODE BIOPSY     CHOLECYSTECTOMY     ESOPHAGOGASTRODUODENOSCOPY (EGD) WITH PROPOFOL N/A 09/08/2021   Procedure: ESOPHAGOGASTRODUODENOSCOPY (EGD) WITH PROPOFOL;  Surgeon: Willis Modena, MD;  Location: [facility];  Service: Gastroenterology;  Laterality: N/A;   WISDOM TOOTH EXTRACTION     Patient Active Problem List   Diagnosis Date Noted   Dehydration secondary to intractable nausea and poor PO intake  12/31/2021   Thrombocytosis 12/31/2021   Obesity, Class III, BMI 40-49.9 (morbid obesity) (HCC) 09/06/2021   Pyuria 09/05/2021   Paresthesia of upper limb 09/05/2021   Hypoglycemia 09/02/2021   Hypokalemia 09/02/2021   Abdominal pain 09/02/2021   Nausea without vomiting 09/02/2021   Avoidant-restrictive food intake disorder (ARFID) 10/13/2019   Mild episode of recurrent major depressive disorder (HCC) 10/13/2019   Mood disorder (HCC) 03/06/2017   Intrinsic eczema 10/11/2015   Seasonal allergic rhinitis due to pollen 08/05/2015    REFERRING DIAG: M25.569 (ICD-10-CM) - Anterior knee pain   THERAPY DIAG:  Chronic pain of right knee  Muscle weakness  (generalized)  Other abnormalities of gait and mobility  Rationale for Evaluation and Treatment Rehabilitation  PERTINENT HISTORY: None   PRECAUTIONS: None   SUBJECTIVE:                                                                                                                                                                                      SUBJECTIVE STATEMENT:  Reporting 6/10 pain today.  Stairs aggravate symptoms especially ascending.   PAIN:  Are you having pain?  Yes: NPRS scale: 7/10 Worst: 9/10  Pain location: bilateral knee R>L Pain description: sharp  Aggravating factors: prolonged standing,  driving, stairs Relieving factors: medication,    OBJECTIVE: (objective measures completed at initial evaluation unless otherwise dated)  DIAGNOSTIC FINDINGS:             N/A   PATIENT SURVEYS:  FOTO: 41% function; 59% predicted    COGNITION: Overall cognitive status: Within functional limits for tasks assessed                         SENSATION: WFL   POSTURE: rounded shoulders, forward head, and genu valgus   PALPATION: TTP to medial R knee joint line, painful patellar glides R    LOWER EXTREMITY ROM:   Active ROM Right eval Left eval  Hip flexion      Hip extension      Hip abduction      Hip adduction      Hip internal rotation      Hip external rotation      Knee flexion WNL WNL  Knee extension WNL WNL  Ankle dorsiflexion      Ankle plantarflexion      Ankle inversion      Ankle eversion       (Blank rows = not tested)   LOWER EXTREMITY MMT:   MMT Right eval Left eval  Hip flexion      Hip extension      Hip abduction 3+/5 3+/5  Hip adduction      Hip internal rotation      Hip external rotation      Knee flexion 4/5 4/5  Knee extension 3+/5 4/5  Ankle dorsiflexion      Ankle plantarflexion      Ankle inversion      Ankle eversion       (Blank rows = not tested)   LOWER EXTREMITY SPECIAL TESTS:  Knee special tests: Lachman  Test: negative   FUNCTIONAL TESTS:  30 Second Sit to Stand: 10 reps with pain   GAIT: Distance walked: 71ft Assistive device utilized: None Level of assistance: Complete Independence Comments: antalgic gait R, bilateral out-toeing      TREATMENT: [facility] Adult PT Treatment:                                                DATE: 06/12/22 Therapeutic Exercise: Nustep L4 8 min Quad sets x 15 - 5" hold Supine SLR x15 each 2# S/L hip abd 15x 2# SAQ 2# 15x Bridge with ball 15x Bridge against GTB 15x Supine hip fallouts GTB 15x Bil, 15/15 unilateral LAQ w/ball squeeze 2# 15x TKE GTB 3 way pull 15x ea. PF against wall 15x B, 15/15 unilaterallly Eccentric heel tap 15/15 4 in (side step position) Ice 10 min   [facility] Adult PT Treatment:                                                DATE: 06/10/2022 Therapeutic Exercise: Rec bike lvl 2.0 x 3 min while taking subjective Quad sets x 10 - 5" hold Supine SLR 2x15 each S/L hip abd 2x10 each Bridge with ball 2x10 LAQ 2x10 3# each TKE 2x10 each  Eccentric heel tap 2x10 2in STS 2x10 - high table Modalites: GameReady - x 10  min - 38 low compression Right knee  [facility] Adult PT Treatment:                                                DATE: 06/06/2022 Therapeutic Exercise: Rec bike lvl 2.0 x 3 min while taking subjective Quad sets x 10 - 5" hold Supine SLR 2x15 each S/L clamshell 2x15 BTB Bridge 2x10 Lateral walk YTB x 3 laps at counter Standing hip abd/ext 2x10 YTB TKE 2x10 each  S/L hip abd x 5 Seated figure 4 stretch x 30" each  [facility] Adult PT Treatment:                                                DATE: 05/28/2022 Therapeutic Exercise: Quad sets x 10 - 5" hold Supine SLR x 5 each S/L hip abd x 5 Seated figure 4 stretch x 30" each   PATIENT EDUCATION:  Education details: eval findings, FOTO, HEP, POC Person educated: Patient Education method: Explanation, Demonstration, and Handouts Education comprehension: verbalized  understanding and returned demonstration   HOME EXERCISE PROGRAM: Access Code: [email or web address] URL: [email or web address] Date: 06/06/2022 Prepared by: Edwinna Areola  Exercises - Supine Quadricep Sets  - 1 x daily - 7 x weekly - 2 sets - 10 reps - 5 sec hold - Active Straight Leg Raise with Quad Set  - 1 x daily - 7 x weekly - 2 sets - 10 reps - Sidelying Hip Abduction  - 1 x daily - 7 x weekly - 2 sets - 10 reps - Seated Piriformis Stretch with Trunk Bend  - 1 x daily - 7 x weekly - 2 sets - 30 sec hold - Side Stepping with Resistance at Ankles and Counter Support  - 1 x daily - 7 x weekly - 3 reps - yellow band hold - Standing Hip Abduction with Resistance at Ankles and Counter Support  - 1 x daily - 7 x weekly - 2 sets - 10 reps - yellow back hold   ASSESSMENT:   CLINICAL IMPRESSION: Continued anterior knee pain R, appears soft tissue in nature with possible patella alta contributing to symptoms.  Added ankle and hip strengthening tasks to correct knee mechanic and relieve soft tissue strain.    OBJECTIVE IMPAIRMENTS: decreased activity tolerance, decreased mobility, difficulty walking, decreased strength, and pain.    ACTIVITY LIMITATIONS: lifting, standing, squatting, stairs, and transfers   PARTICIPATION LIMITATIONS: driving, shopping, community activity, occupation, and yard work   PERSONAL FACTORS: Time since onset of injury/illness/exacerbation are also affecting patient's functional outcome.      GOALS: Goals reviewed with patient? No   SHORT TERM GOALS: Target date: 06/17/2022   Pt will be compliant and knowledgeable with initial HEP for improved comfort and carryover Baseline: initial HEP given  Goal status: INITIAL   2.  Pt will self report bilateral knee pain no greater than 6/10 for improved comfort and functional ability Baseline: 9/10 at worst Goal status: INITIAL      LONG TERM GOALS: Target date: 07/22/2022   Pt will improve FOTO function score to no  less than 59% as proxy for functional improvement Baseline: 41% function Goal status: INITIAL    2.  Pt will self report  bilateral knee pain no greater than 3/10 for improved comfort and functional ability Baseline: 9/10 at worst Goal status: INITIAL    3.  Pt will increase 30 Second Sit to Stand rep count to no less than 12 reps for improved balance, strength, and functional mobility Baseline: 10 reps  Goal status: INITIAL    4.  Pt will be able to ambulate up/down a flight of stairs with no increase in bilateral knee pain and reciprocal gait for improved comfort with home and community activities  Baseline: unable Goal status: INITIAL     PLAN:   PT FREQUENCY: 2x/week   PT DURATION: 8 weeks   PLANNED INTERVENTIONS: Therapeutic exercises, Therapeutic activity, Neuromuscular re-education, Balance training, Gait training, Patient/Family education, Self Care, Joint mobilization, Aquatic Therapy, Dry Needling, Electrical stimulation, Vasopneumatic device, Manual therapy, and Re-evaluation   PLAN FOR NEXT SESSION: progress quad and proximal hip strength   Hildred Laser, PT 06/12/2022, 10:46 AM

## 2022-06-10 NOTE — Therapy (Signed)
OUTPATIENT PHYSICAL THERAPY TREATMENT NOTE   Patient Name: Kristen Pacheco MRN: [id number] DOB:2000/06/01, 22 y.o., female Today's Date: 06/10/2022  PCP: No PCP  REFERRING PROVIDER: Renette Butters, MD   END OF SESSION:   PT End of Session - 06/10/22 1002     Visit Number 3    Number of Visits 17    Date for PT Re-Evaluation 07/23/22    Authorization Type [place] [facility]    PT Start Time 1002    PT Stop Time 1040    PT Time Calculation (min) 38 min    Activity Tolerance Patient tolerated treatment well    Behavior During Therapy WFL for tasks assessed/performed              Past Medical History:  Diagnosis Date   Avoidant-restrictive food intake disorder (ARFID)    Depression    Hyperlipidemia    OCD (obsessive compulsive disorder)    Past Surgical History:  Procedure Laterality Date   AXILLARY LYMPH NODE BIOPSY     CHOLECYSTECTOMY     ESOPHAGOGASTRODUODENOSCOPY (EGD) WITH PROPOFOL N/A 09/08/2021   Procedure: ESOPHAGOGASTRODUODENOSCOPY (EGD) WITH PROPOFOL;  Surgeon: Arta Silence, MD;  Location: [facility];  Service: Gastroenterology;  Laterality: N/A;   WISDOM TOOTH EXTRACTION     Patient Active Problem List   Diagnosis Date Noted   Dehydration secondary to intractable nausea and poor PO intake  12/31/2021   Thrombocytosis 12/31/2021   Obesity, Class III, BMI 40-49.9 (morbid obesity) ([place]) 09/06/2021   Pyuria 09/05/2021   Paresthesia of upper limb 09/05/2021   Hypoglycemia 09/02/2021   Hypokalemia 09/02/2021   Abdominal pain 09/02/2021   Nausea without vomiting 09/02/2021   Avoidant-restrictive food intake disorder (ARFID) 10/13/2019   Mild episode of recurrent major depressive disorder ([place]) 10/13/2019   Mood disorder ([place]) 03/06/2017   Intrinsic eczema 10/11/2015   Seasonal allergic rhinitis due to pollen 08/05/2015    REFERRING DIAG: M25.569 (ICD-10-CM) - Anterior knee pain   THERAPY DIAG:  Chronic pain of right knee  Muscle weakness  (generalized)  Other abnormalities of gait and mobility  Rationale for Evaluation and Treatment Rehabilitation  PERTINENT HISTORY: None   PRECAUTIONS: None   SUBJECTIVE:                                                                                                                                                                                      SUBJECTIVE STATEMENT:  Pt presents to PT with reports of continued knee pain after moving over the weekend. Has been compliant with HEP.    PAIN:  Are you having pain?  Yes: NPRS scale: 7/10 Worst: 9/10  Pain location:  bilateral knee R>L Pain description: sharp  Aggravating factors: prolonged standing, driving, stairs Relieving factors: medication,    OBJECTIVE: (objective measures completed at initial evaluation unless otherwise dated)  DIAGNOSTIC FINDINGS:             N/A   PATIENT SURVEYS:  FOTO: 41% function; 59% predicted    COGNITION: Overall cognitive status: Within functional limits for tasks assessed                         SENSATION: WFL   POSTURE: rounded shoulders, forward head, and genu valgus   PALPATION: TTP to medial R knee joint line, painful patellar glides R    LOWER EXTREMITY ROM:   Active ROM Right eval Left eval  Hip flexion      Hip extension      Hip abduction      Hip adduction      Hip internal rotation      Hip external rotation      Knee flexion WNL WNL  Knee extension WNL WNL  Ankle dorsiflexion      Ankle plantarflexion      Ankle inversion      Ankle eversion       (Blank rows = not tested)   LOWER EXTREMITY MMT:   MMT Right eval Left eval  Hip flexion      Hip extension      Hip abduction 3+/5 3+/5  Hip adduction      Hip internal rotation      Hip external rotation      Knee flexion 4/5 4/5  Knee extension 3+/5 4/5  Ankle dorsiflexion      Ankle plantarflexion      Ankle inversion      Ankle eversion       (Blank rows = not tested)   LOWER EXTREMITY SPECIAL  TESTS:  Knee special tests: Lachman Test: negative   FUNCTIONAL TESTS:  30 Second Sit to Stand: 10 reps with pain   GAIT: Distance walked: 55ft Assistive device utilized: None Level of assistance: Complete Independence Comments: antalgic gait R, bilateral out-toeing      TREATMENT: [facility] Adult PT Treatment:                                                DATE: 06/10/2022 Therapeutic Exercise: Rec bike lvl 2.0 x 3 min while taking subjective Quad sets x 10 - 5" hold Supine SLR 2x15 each S/L hip abd 2x10 each Bridge with ball 2x10 LAQ 2x10 3# each TKE 2x10 each  Eccentric heel tap 2x10 2in STS 2x10 - high table Modalites: GameReady - x 10 min - 38 low compression Right knee  [facility] Adult PT Treatment:                                                DATE: 06/06/2022 Therapeutic Exercise: Rec bike lvl 2.0 x 3 min while taking subjective Quad sets x 10 - 5" hold Supine SLR 2x15 each S/L clamshell 2x15 BTB Bridge 2x10 Lateral walk YTB x 3 laps at counter Standing hip abd/ext 2x10 YTB TKE 2x10 each  S/L hip abd x 5 Seated figure 4 stretch x  30" each  [facility] Adult PT Treatment:                                                DATE: 05/28/2022 Therapeutic Exercise: Quad sets x 10 - 5" hold Supine SLR x 5 each S/L hip abd x 5 Seated figure 4 stretch x 30" each   PATIENT EDUCATION:  Education details: eval findings, FOTO, HEP, POC Person educated: Patient Education method: Explanation, Demonstration, and Handouts Education comprehension: verbalized understanding and returned demonstration   HOME EXERCISE PROGRAM: Access Code: [email or web address] URL: [email or web address] Date: 06/06/2022 Prepared by: Octavio Manns  Exercises - Supine Quadricep Sets  - 1 x daily - 7 x weekly - 2 sets - 10 reps - 5 sec hold - Active Straight Leg Raise with Quad Set  - 1 x daily - 7 x weekly - 2 sets - 10 reps - Sidelying Hip Abduction  - 1 x daily - 7 x weekly - 2 sets - 10 reps - Seated  Piriformis Stretch with Trunk Bend  - 1 x daily - 7 x weekly - 2 sets - 30 sec hold - Side Stepping with Resistance at Ankles and Counter Support  - 1 x daily - 7 x weekly - 3 reps - yellow band hold - Standing Hip Abduction with Resistance at Ankles and Counter Support  - 1 x daily - 7 x weekly - 2 sets - 10 reps - yellow back hold   ASSESSMENT:   CLINICAL IMPRESSION: Pt was able to complete all prescribed exercises with no adverse effect or increase in pain. Therapy focused on improving quad and proximal hip strength in order to decrease pain and improve comfort. Will continue per POC as prescribed   OBJECTIVE IMPAIRMENTS: decreased activity tolerance, decreased mobility, difficulty walking, decreased strength, and pain.    ACTIVITY LIMITATIONS: lifting, standing, squatting, stairs, and transfers   PARTICIPATION LIMITATIONS: driving, shopping, community activity, occupation, and yard work   PERSONAL FACTORS: Time since onset of injury/illness/exacerbation are also affecting patient's functional outcome.      GOALS: Goals reviewed with patient? No   SHORT TERM GOALS: Target date: 06/17/2022   Pt will be compliant and knowledgeable with initial HEP for improved comfort and carryover Baseline: initial HEP given  Goal status: INITIAL   2.  Pt will self report bilateral knee pain no greater than 6/10 for improved comfort and functional ability Baseline: 9/10 at worst Goal status: INITIAL      LONG TERM GOALS: Target date: 07/22/2022   Pt will improve FOTO function score to no less than 59% as proxy for functional improvement Baseline: 41% function Goal status: INITIAL    2.  Pt will self report bilateral knee pain no greater than 3/10 for improved comfort and functional ability Baseline: 9/10 at worst Goal status: INITIAL    3.  Pt will increase 30 Second Sit to Stand rep count to no less than 12 reps for improved balance, strength, and functional mobility Baseline: 10 reps  Goal  status: INITIAL    4.  Pt will be able to ambulate up/down a flight of stairs with no increase in bilateral knee pain and reciprocal gait for improved comfort with home and community activities  Baseline: unable Goal status: INITIAL     PLAN:   PT FREQUENCY: 2x/week   PT DURATION: 8 weeks  PLANNED INTERVENTIONS: Therapeutic exercises, Therapeutic activity, Neuromuscular re-education, Balance training, Gait training, Patient/Family education, Self Care, Joint mobilization, Aquatic Therapy, Dry Needling, Electrical stimulation, Vasopneumatic device, Manual therapy, and Re-evaluation   PLAN FOR NEXT SESSION: progress quad and proximal hip strength   Ward Chatters, PT 06/10/2022, 10:42 AM

## 2022-06-11 ENCOUNTER — Encounter: Payer: Medicaid Other | Attending: Internal Medicine | Admitting: Registered"

## 2022-06-11 ENCOUNTER — Encounter: Payer: Self-pay | Admitting: Registered"

## 2022-06-11 ENCOUNTER — Ambulatory Visit: Payer: Medicaid Other

## 2022-06-11 DIAGNOSIS — F5082 Avoidant/restrictive food intake disorder: Secondary | ICD-10-CM | POA: Diagnosis not present

## 2022-06-11 DIAGNOSIS — Z713 Dietary counseling and surveillance: Secondary | ICD-10-CM | POA: Insufficient documentation

## 2022-06-11 NOTE — Patient Instructions (Addendum)
-   Practice making sub sandwich with preferred ingredients as possible lunch option for school.   - Breakfast to include at least 2 items for balance: protein and carbohydrates.
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
## 2022-06-12 ENCOUNTER — Ambulatory Visit: Payer: Medicaid Other

## 2022-06-12 DIAGNOSIS — M25561 Pain in right knee: Secondary | ICD-10-CM | POA: Diagnosis not present

## 2022-06-12 DIAGNOSIS — M6281 Muscle weakness (generalized): Secondary | ICD-10-CM

## 2022-06-12 DIAGNOSIS — G8929 Other chronic pain: Secondary | ICD-10-CM

## 2022-06-12 DIAGNOSIS — R2689 Other abnormalities of gait and mobility: Secondary | ICD-10-CM

## 2022-06-17 ENCOUNTER — Ambulatory Visit: Payer: No Typology Code available for payment source

## 2022-06-18 NOTE — Therapy (Signed)
OUTPATIENT PHYSICAL THERAPY TREATMENT NOTE   Patient Name: Kristen Pacheco MRN: [id number] DOB:2000/08/23, 22 y.o., female Today's Date: 06/19/2022  PCP: No PCP  REFERRING PROVIDER: Renette Butters, MD   END OF SESSION:   PT End of Session - 06/19/22 1707     Visit Number 5    Number of Visits 17    Date for PT Re-Evaluation 07/23/22    Authorization Type [place] [facility]    PT Start Time 1615    PT Stop Time 1700    PT Time Calculation (min) 45 min    Activity Tolerance Patient tolerated treatment well    Behavior During Therapy WFL for tasks assessed/performed              Past Medical History:  Diagnosis Date   Avoidant-restrictive food intake disorder (ARFID)    Depression    Hyperlipidemia    OCD (obsessive compulsive disorder)    Past Surgical History:  Procedure Laterality Date   AXILLARY LYMPH NODE BIOPSY     CHOLECYSTECTOMY     ESOPHAGOGASTRODUODENOSCOPY (EGD) WITH PROPOFOL N/A 09/08/2021   Procedure: ESOPHAGOGASTRODUODENOSCOPY (EGD) WITH PROPOFOL;  Surgeon: Arta Silence, MD;  Location: [facility];  Service: Gastroenterology;  Laterality: N/A;   WISDOM TOOTH EXTRACTION     Patient Active Problem List   Diagnosis Date Noted   Dehydration secondary to intractable nausea and poor PO intake  12/31/2021   Thrombocytosis 12/31/2021   Obesity, Class III, BMI 40-49.9 (morbid obesity) 09/06/2021   Pyuria 09/05/2021   Paresthesia of upper limb 09/05/2021   Hypoglycemia 09/02/2021   Hypokalemia 09/02/2021   Abdominal pain 09/02/2021   Nausea without vomiting 09/02/2021   Avoidant-restrictive food intake disorder (ARFID) 10/13/2019   Mild episode of recurrent major depressive disorder 10/13/2019   Mood disorder 03/06/2017   Intrinsic eczema 10/11/2015   Seasonal allergic rhinitis due to pollen 08/05/2015    REFERRING DIAG: M25.569 (ICD-10-CM) - Anterior knee pain   THERAPY DIAG:  Chronic pain of right knee  Muscle weakness (generalized)  Other  abnormalities of gait and mobility  Rationale for Evaluation and Treatment Rehabilitation  PERTINENT HISTORY: None   PRECAUTIONS: None   SUBJECTIVE:                                                                                                                                                                                      SUBJECTIVE STATEMENT:  Reporting 6/10 pain today.  Stairs aggravate symptoms especially ascending.   PAIN:  Are you having pain?  Yes: NPRS scale: 7/10 Worst: 9/10  Pain location: bilateral knee R>L Pain description: sharp  Aggravating factors: prolonged standing, driving, stairs Relieving  factors: medication,    OBJECTIVE: (objective measures completed at initial evaluation unless otherwise dated)  DIAGNOSTIC FINDINGS:             N/A   PATIENT SURVEYS:  FOTO: 41% function; 59% predicted    COGNITION: Overall cognitive status: Within functional limits for tasks assessed                         SENSATION: WFL   POSTURE: rounded shoulders, forward head, and genu valgus   PALPATION: TTP to medial R knee joint line, painful patellar glides R    LOWER EXTREMITY ROM:   Active ROM Right eval Left eval  Hip flexion      Hip extension      Hip abduction      Hip adduction      Hip internal rotation      Hip external rotation      Knee flexion WNL WNL  Knee extension WNL WNL  Ankle dorsiflexion      Ankle plantarflexion      Ankle inversion      Ankle eversion       (Blank rows = not tested)   LOWER EXTREMITY MMT:   MMT Right eval Left eval  Hip flexion      Hip extension      Hip abduction 3+/5 3+/5  Hip adduction      Hip internal rotation      Hip external rotation      Knee flexion 4/5 4/5  Knee extension 3+/5 4/5  Ankle dorsiflexion      Ankle plantarflexion      Ankle inversion      Ankle eversion       (Blank rows = not tested)   LOWER EXTREMITY SPECIAL TESTS:  Knee special tests: Lachman Test: negative    FUNCTIONAL TESTS:  30 Second Sit to Stand: 10 reps with pain   GAIT: Distance walked: 41ft Assistive device utilized: None Level of assistance: Complete Independence Comments: antalgic gait R, bilateral out-toeing      TREATMENT: [facility] Adult PT Treatment:                                                DATE: 06/19/22 Therapeutic Exercise: Nustep L5 8 min Supine SLR x15 each 3# R S/L hip abd 15x 3# R SAQ 3# 15x R Bridge with 5# ball 15x Bridge against BluTB 15x Supine hip fallouts BluTB 15x Bil, 15/15 unilateral L S/L R clams BluTB 15x LAQ w/ball squeeze 3# 15x TKE BluTB 3 way pull 15x ea. PF against wall 15x B, 15/15 unilaterallly Eccentric heel tap R 15x 4 in (side step position) Taping applied to R knee using medial patellar glide technique.  Patient instructed to remove tape with any increased pain, skin irritation or in the event the tape loosens and can not be re-applied. Cautioned to never apply Leukotape directly over skin without protective cover roll in place.    [facility] Adult PT Treatment:                                                DATE: 06/12/22 Therapeutic Exercise: Nustep L4 8 min Quad sets  x 15 - 5" hold Supine SLR x15 each 2# S/L hip abd 15x 2# SAQ 2# 15x Bridge with ball 15x Bridge against GTB 15x Supine hip fallouts GTB 15x Bil, 15/15 unilateral LAQ w/ball squeeze 2# 15x TKE GTB 3 way pull 15x ea. PF against wall 15x B, 15/15 unilaterallly Eccentric heel tap 15/15 4 in (side step position) Ice 10 min   [facility] Adult PT Treatment:                                                DATE: 06/10/2022 Therapeutic Exercise: Rec bike lvl 2.0 x 3 min while taking subjective Quad sets x 10 - 5" hold Supine SLR 2x15 each S/L hip abd 2x10 each Bridge with ball 2x10 LAQ 2x10 3# each TKE 2x10 each  Eccentric heel tap 2x10 2in STS 2x10 - high table Modalites: GameReady - x 10 min - 38 low compression Right knee  [facility] Adult PT Treatment:                                                 DATE: 06/06/2022 Therapeutic Exercise: Rec bike lvl 2.0 x 3 min while taking subjective Quad sets x 10 - 5" hold Supine SLR 2x15 each S/L clamshell 2x15 BTB Bridge 2x10 Lateral walk YTB x 3 laps at counter Standing hip abd/ext 2x10 YTB TKE 2x10 each  S/L hip abd x 5 Seated figure 4 stretch x 30" each  [facility] Adult PT Treatment:                                                DATE: 05/28/2022 Therapeutic Exercise: Quad sets x 10 - 5" hold Supine SLR x 5 each S/L hip abd x 5 Seated figure 4 stretch x 30" each   PATIENT EDUCATION:  Education details: eval findings, FOTO, HEP, POC Person educated: Patient Education method: Explanation, Demonstration, and Handouts Education comprehension: verbalized understanding and returned demonstration   HOME EXERCISE PROGRAM: Access Code: [email or web address] URL: [email or web address] Date: 06/06/2022 Prepared by: Octavio Manns  Exercises - Supine Quadricep Sets  - 1 x daily - 7 x weekly - 2 sets - 10 reps - 5 sec hold - Active Straight Leg Raise with Quad Set  - 1 x daily - 7 x weekly - 2 sets - 10 reps - Sidelying Hip Abduction  - 1 x daily - 7 x weekly - 2 sets - 10 reps - Seated Piriformis Stretch with Trunk Bend  - 1 x daily - 7 x weekly - 2 sets - 30 sec hold - Side Stepping with Resistance at Ankles and Counter Support  - 1 x daily - 7 x weekly - 3 reps - yellow band hold - Standing Hip Abduction with Resistance at Ankles and Counter Support  - 1 x daily - 7 x weekly - 2 sets - 10 reps - yellow back hold   ASSESSMENT:   CLINICAL IMPRESSION: Reports little to no knee pain since last session.  Increased weight and resistance as noted.  Applied McConnell tape to R knee using medial glide technique which  minimize any symptoms.  VL/VMO sequence abnormal creating a lateral patellar pull.  Palpable point tenderness to medial plica noted.  Able to tolerate all tasks today w/o any increased symptoms.   OBJECTIVE IMPAIRMENTS:  decreased activity tolerance, decreased mobility, difficulty walking, decreased strength, and pain.    ACTIVITY LIMITATIONS: lifting, standing, squatting, stairs, and transfers   PARTICIPATION LIMITATIONS: driving, shopping, community activity, occupation, and yard work   PERSONAL FACTORS: Time since onset of injury/illness/exacerbation are also affecting patient's functional outcome.      GOALS: Goals reviewed with patient? No   SHORT TERM GOALS: Target date: 06/17/2022   Pt will be compliant and knowledgeable with initial HEP for improved comfort and carryover Baseline: initial HEP given  Goal status: Met   2.  Pt will self report bilateral knee pain no greater than 6/10 for improved comfort and functional ability Baseline: 9/10 at worst; 06/19/22 Minimal pain cited today Goal status: Met     LONG TERM GOALS: Target date: 07/22/2022   Pt will improve FOTO function score to no less than 59% as proxy for functional improvement Baseline: 41% function Goal status: INITIAL    2.  Pt will self report bilateral knee pain no greater than 3/10 for improved comfort and functional ability Baseline: 9/10 at worst Goal status: INITIAL    3.  Pt will increase 30 Second Sit to Stand rep count to no less than 12 reps for improved balance, strength, and functional mobility Baseline: 10 reps  Goal status: INITIAL    4.  Pt will be able to ambulate up/down a flight of stairs with no increase in bilateral knee pain and reciprocal gait for improved comfort with home and community activities  Baseline: unable Goal status: INITIAL     PLAN:   PT FREQUENCY: 2x/week   PT DURATION: 8 weeks   PLANNED INTERVENTIONS: Therapeutic exercises, Therapeutic activity, Neuromuscular re-education, Balance training, Gait training, Patient/Family education, Self Care, Joint mobilization, Aquatic Therapy, Dry Needling, Electrical stimulation, Vasopneumatic device, Manual therapy, and Re-evaluation   PLAN FOR  NEXT SESSION: progress quad and proximal hip strength   Lanice Shirts, PT 06/19/2022, 5:15 PM

## 2022-06-19 ENCOUNTER — Ambulatory Visit: Payer: No Typology Code available for payment source | Attending: Orthopedic Surgery

## 2022-06-19 DIAGNOSIS — M25561 Pain in right knee: Secondary | ICD-10-CM | POA: Insufficient documentation

## 2022-06-19 DIAGNOSIS — R2689 Other abnormalities of gait and mobility: Secondary | ICD-10-CM

## 2022-06-19 DIAGNOSIS — G8929 Other chronic pain: Secondary | ICD-10-CM | POA: Diagnosis present

## 2022-06-19 DIAGNOSIS — M6281 Muscle weakness (generalized): Secondary | ICD-10-CM | POA: Diagnosis present

## 2022-06-24 ENCOUNTER — Ambulatory Visit: Payer: No Typology Code available for payment source

## 2022-06-24 DIAGNOSIS — M6281 Muscle weakness (generalized): Secondary | ICD-10-CM

## 2022-06-24 DIAGNOSIS — R2689 Other abnormalities of gait and mobility: Secondary | ICD-10-CM

## 2022-06-24 DIAGNOSIS — M25561 Pain in right knee: Secondary | ICD-10-CM | POA: Diagnosis not present

## 2022-06-24 DIAGNOSIS — G8929 Other chronic pain: Secondary | ICD-10-CM

## 2022-06-24 NOTE — Therapy (Signed)
OUTPATIENT PHYSICAL THERAPY TREATMENT NOTE   Patient Name: Kristen Pacheco MRN: [id number] DOB:12/01/2000, 22 y.o., female Today's Date: 06/24/2022  PCP: No PCP  REFERRING PROVIDER: Sheral Apley, MD   END OF SESSION:   PT End of Session - 06/24/22 1618     Visit Number 6    Number of Visits 17    Date for PT Re-Evaluation 07/23/22    Authorization Type [place] [facility]    PT Start Time 1618    PT Stop Time 1700    PT Time Calculation (min) 42 min    Activity Tolerance Patient tolerated treatment well    Behavior During Therapy WFL for tasks assessed/performed              Past Medical History:  Diagnosis Date   Avoidant-restrictive food intake disorder (ARFID)    Depression    Hyperlipidemia    OCD (obsessive compulsive disorder)    Past Surgical History:  Procedure Laterality Date   AXILLARY LYMPH NODE BIOPSY     CHOLECYSTECTOMY     ESOPHAGOGASTRODUODENOSCOPY (EGD) WITH PROPOFOL N/A 09/08/2021   Procedure: ESOPHAGOGASTRODUODENOSCOPY (EGD) WITH PROPOFOL;  Surgeon: Willis Modena, MD;  Location: [facility];  Service: Gastroenterology;  Laterality: N/A;   WISDOM TOOTH EXTRACTION     Patient Active Problem List   Diagnosis Date Noted   Dehydration secondary to intractable nausea and poor PO intake  12/31/2021   Thrombocytosis 12/31/2021   Obesity, Class III, BMI 40-49.9 (morbid obesity) 09/06/2021   Pyuria 09/05/2021   Paresthesia of upper limb 09/05/2021   Hypoglycemia 09/02/2021   Hypokalemia 09/02/2021   Abdominal pain 09/02/2021   Nausea without vomiting 09/02/2021   Avoidant-restrictive food intake disorder (ARFID) 10/13/2019   Mild episode of recurrent major depressive disorder 10/13/2019   Mood disorder 03/06/2017   Intrinsic eczema 10/11/2015   Seasonal allergic rhinitis due to pollen 08/05/2015    REFERRING DIAG: M25.569 (ICD-10-CM) - Anterior knee pain   THERAPY DIAG:  Chronic pain of right knee  Muscle weakness (generalized)  Other  abnormalities of gait and mobility  Rationale for Evaluation and Treatment Rehabilitation  PERTINENT HISTORY: None   PRECAUTIONS: None   SUBJECTIVE:                                                                                                                                                                                      SUBJECTIVE STATEMENT:  Knee pain less, felt taping was helpful   PAIN:  Are you having pain?  Yes: NPRS scale: 7/10 Worst: 9/10  Pain location: bilateral knee R>L Pain description: sharp  Aggravating factors: prolonged standing, driving, stairs Relieving factors: medication,  OBJECTIVE: (objective measures completed at initial evaluation unless otherwise dated)  DIAGNOSTIC FINDINGS:             N/A   PATIENT SURVEYS:  FOTO: 41% function; 59% predicted    COGNITION: Overall cognitive status: Within functional limits for tasks assessed                         SENSATION: WFL   POSTURE: rounded shoulders, forward head, and genu valgus   PALPATION: TTP to medial R knee joint line, painful patellar glides R    LOWER EXTREMITY ROM:   Active ROM Right eval Left eval  Hip flexion      Hip extension      Hip abduction      Hip adduction      Hip internal rotation      Hip external rotation      Knee flexion WNL WNL  Knee extension WNL WNL  Ankle dorsiflexion      Ankle plantarflexion      Ankle inversion      Ankle eversion       (Blank rows = not tested)   LOWER EXTREMITY MMT:   MMT Right eval Left eval  Hip flexion      Hip extension      Hip abduction 3+/5 3+/5  Hip adduction      Hip internal rotation      Hip external rotation      Knee flexion 4/5 4/5  Knee extension 3+/5 4/5  Ankle dorsiflexion      Ankle plantarflexion      Ankle inversion      Ankle eversion       (Blank rows = not tested)   LOWER EXTREMITY SPECIAL TESTS:  Knee special tests: Lachman Test: negative   FUNCTIONAL TESTS:  30 Second Sit to Stand:  10 reps with pain   GAIT: Distance walked: 33ft Assistive device utilized: None Level of assistance: Complete Independence Comments: antalgic gait R, bilateral out-toeing      TREATMENT: [facility] Adult PT Treatment:                                                DATE: 06/24/22 Therapeutic Exercise: Nustep L4 8 min Supine SLR x15 each 4# R S/L hip abd 15x 4# R SAQ 4# 15x R Bridge with 5# ball 15x Bridge against BluTB 15x Supine hip fallouts BluTB 15x Bil, 15/15 unilateral L S/L R clams BluTB 15x2 LAQ w/ball squeeze 4# 15x TKE BluTB 3 way pull 15x ea. PF against wall 15x B, 15/15 unilaterallly Eccentric heel tap R 15x 6 in (side step position) Taping applied to R knee using patellar tendinitis technique.  Patient instructed to remove tape with any increased pain, skin irritation or in the event the tape loosens and can not be re-applied. Cautioned to never apply Leukotape directly over skin without protective cover roll in place.   [facility] Adult PT Treatment:                                                DATE: 06/19/22 Therapeutic Exercise: Nustep L5 8 min Supine SLR x15 each 3# R S/L hip abd  15x 3# R SAQ 3# 15x R Bridge with 5# ball 15x Bridge against BluTB 15x Supine hip fallouts BluTB 15x Bil, 15/15 unilateral L S/L R clams BluTB 15x LAQ w/ball squeeze 3# 15x TKE BluTB 3 way pull 15x ea. PF against wall 15x B, 15/15 unilaterallly Eccentric heel tap R 15x 4 in (side step position) Taping applied to R knee using medial patellar glide technique.  Patient instructed to remove tape with any increased pain, skin irritation or in the event the tape loosens and can not be re-applied. Cautioned to never apply Leukotape directly over skin without protective cover roll in place.    [facility] Adult PT Treatment:                                                DATE: 06/12/22 Therapeutic Exercise: Nustep L4 8 min Quad sets x 15 - 5" hold Supine SLR x15 each 2# S/L hip abd 15x 2# SAQ 2# 15x Bridge  with ball 15x Bridge against GTB 15x Supine hip fallouts GTB 15x Bil, 15/15 unilateral LAQ w/ball squeeze 2# 15x TKE GTB 3 way pull 15x ea. PF against wall 15x B, 15/15 unilaterallly Eccentric heel tap 15/15 4 in (side step position) Ice 10 min   [facility] Adult PT Treatment:                                                DATE: 06/10/2022 Therapeutic Exercise: Rec bike lvl 2.0 x 3 min while taking subjective Quad sets x 10 - 5" hold Supine SLR 2x15 each S/L hip abd 2x10 each Bridge with ball 2x10 LAQ 2x10 3# each TKE 2x10 each  Eccentric heel tap 2x10 2in STS 2x10 - high table Modalites: GameReady - x 10 min - 38 low compression Right knee  [facility] Adult PT Treatment:                                                DATE: 06/06/2022 Therapeutic Exercise: Rec bike lvl 2.0 x 3 min while taking subjective Quad sets x 10 - 5" hold Supine SLR 2x15 each S/L clamshell 2x15 BTB Bridge 2x10 Lateral walk YTB x 3 laps at counter Standing hip abd/ext 2x10 YTB TKE 2x10 each  S/L hip abd x 5 Seated figure 4 stretch x 30" each  [facility] Adult PT Treatment:                                                DATE: 05/28/2022 Therapeutic Exercise: Quad sets x 10 - 5" hold Supine SLR x 5 each S/L hip abd x 5 Seated figure 4 stretch x 30" each   PATIENT EDUCATION:  Education details: eval findings, FOTO, HEP, POC Person educated: Patient Education method: Explanation, Demonstration, and Handouts Education comprehension: verbalized understanding and returned demonstration   HOME EXERCISE PROGRAM: Access Code: [email or web address] URL: [email or web address] Date: 06/06/2022 Prepared by: Edwinna Areola  Exercises - Supine Quadricep Sets  - 1 x  daily - 7 x weekly - 2 sets - 10 reps - 5 sec hold - Active Straight Leg Raise with Quad Set  - 1 x daily - 7 x weekly - 2 sets - 10 reps - Sidelying Hip Abduction  - 1 x daily - 7 x weekly - 2 sets - 10 reps - Seated Piriformis Stretch with Trunk Bend  - 1 x  daily - 7 x weekly - 2 sets - 30 sec hold - Side Stepping with Resistance at Ankles and Counter Support  - 1 x daily - 7 x weekly - 3 reps - yellow band hold - Standing Hip Abduction with Resistance at Ankles and Counter Support  - 1 x daily - 7 x weekly - 2 sets - 10 reps - yellow back hold   ASSESSMENT:   CLINICAL IMPRESSION: Able to wear tape over 24 hours w/o any skin irritation and felt it was beneficial.  Increased weight, reps and resistance as noted, continued to tape R knee for pain relief using patellar tndinitis technique  OBJECTIVE IMPAIRMENTS: decreased activity tolerance, decreased mobility, difficulty walking, decreased strength, and pain.    ACTIVITY LIMITATIONS: lifting, standing, squatting, stairs, and transfers   PARTICIPATION LIMITATIONS: driving, shopping, community activity, occupation, and yard work   PERSONAL FACTORS: Time since onset of injury/illness/exacerbation are also affecting patient's functional outcome.      GOALS: Goals reviewed with patient? No   SHORT TERM GOALS: Target date: 06/17/2022   Pt will be compliant and knowledgeable with initial HEP for improved comfort and carryover Baseline: initial HEP given  Goal status: Met   2.  Pt will self report bilateral knee pain no greater than 6/10 for improved comfort and functional ability Baseline: 9/10 at worst; 06/19/22 Minimal pain cited today Goal status: Met     LONG TERM GOALS: Target date: 07/22/2022   Pt will improve FOTO function score to no less than 59% as proxy for functional improvement Baseline: 41% function Goal status: INITIAL    2.  Pt will self report bilateral knee pain no greater than 3/10 for improved comfort and functional ability Baseline: 9/10 at worst Goal status: INITIAL    3.  Pt will increase 30 Second Sit to Stand rep count to no less than 12 reps for improved balance, strength, and functional mobility Baseline: 10 reps  Goal status: INITIAL    4.  Pt will be able to  ambulate up/down a flight of stairs with no increase in bilateral knee pain and reciprocal gait for improved comfort with home and community activities  Baseline: unable Goal status: INITIAL     PLAN:   PT FREQUENCY: 2x/week   PT DURATION: 8 weeks   PLANNED INTERVENTIONS: Therapeutic exercises, Therapeutic activity, Neuromuscular re-education, Balance training, Gait training, Patient/Family education, Self Care, Joint mobilization, Aquatic Therapy, Dry Needling, Electrical stimulation, Vasopneumatic device, Manual therapy, and Re-evaluation   PLAN FOR NEXT SESSION: progress quad and proximal hip strength   Hildred LaserJeffrey M Clotee Schlicker, PT 06/24/2022, 5:02 PM

## 2022-06-26 ENCOUNTER — Encounter: Payer: Self-pay | Admitting: Registered"

## 2022-06-26 ENCOUNTER — Encounter: Payer: No Typology Code available for payment source | Attending: Internal Medicine | Admitting: Registered"

## 2022-06-26 ENCOUNTER — Ambulatory Visit: Payer: No Typology Code available for payment source

## 2022-06-26 DIAGNOSIS — M25561 Pain in right knee: Secondary | ICD-10-CM | POA: Diagnosis not present

## 2022-06-26 DIAGNOSIS — M6281 Muscle weakness (generalized): Secondary | ICD-10-CM

## 2022-06-26 DIAGNOSIS — Z713 Dietary counseling and surveillance: Secondary | ICD-10-CM | POA: Diagnosis not present

## 2022-06-26 DIAGNOSIS — R2689 Other abnormalities of gait and mobility: Secondary | ICD-10-CM

## 2022-06-26 DIAGNOSIS — G8929 Other chronic pain: Secondary | ICD-10-CM

## 2022-06-26 NOTE — Patient Instructions (Addendum)
-   Try [employer] chicken strips with fruit cup. Eat at home for comfort.   - Keep up the great work having 3 meals a day and staying hydrated!
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
## 2022-06-26 NOTE — Therapy (Signed)
OUTPATIENT PHYSICAL THERAPY TREATMENT NOTE/DISCHARGE  PHYSICAL THERAPY DISCHARGE SUMMARY  Visits from Start of Care: 7  Current functional level related to goals / functional outcomes: See goals and objective   Remaining deficits: See goals and objective   Education / Equipment: HEP   Patient agrees to discharge. Patient goals were met. Patient is being discharged due to meeting the stated rehab goals.   Patient Name: Kristen Pacheco MRN: [id number] DOB:February 20, 2001, 22 y.o., female Today's Date: 06/26/2022  PCP: No PCP  REFERRING PROVIDER: Sheral Apley, MD   END OF SESSION:   PT End of Session - 06/26/22 1557     Visit Number 7    Number of Visits 17    Date for PT Re-Evaluation 07/23/22    Authorization Type [facility]    PT Start Time 1615    PT Stop Time 1644    PT Time Calculation (min) 29 min    Activity Tolerance Patient tolerated treatment well    Behavior During Therapy WFL for tasks assessed/performed               Past Medical History:  Diagnosis Date   Avoidant-restrictive food intake disorder (ARFID)    Depression    Hyperlipidemia    OCD (obsessive compulsive disorder)    Past Surgical History:  Procedure Laterality Date   AXILLARY LYMPH NODE BIOPSY     CHOLECYSTECTOMY     ESOPHAGOGASTRODUODENOSCOPY (EGD) WITH PROPOFOL N/A 09/08/2021   Procedure: ESOPHAGOGASTRODUODENOSCOPY (EGD) WITH PROPOFOL;  Surgeon: Willis Modena, MD;  Location: [facility];  Service: Gastroenterology;  Laterality: N/A;   WISDOM TOOTH EXTRACTION     Patient Active Problem List   Diagnosis Date Noted   Dehydration secondary to intractable nausea and poor PO intake  12/31/2021   Thrombocytosis 12/31/2021   Obesity, Class III, BMI 40-49.9 (morbid obesity) 09/06/2021   Pyuria 09/05/2021   Paresthesia of upper limb 09/05/2021   Hypoglycemia 09/02/2021   Hypokalemia 09/02/2021   Abdominal pain 09/02/2021   Nausea without vomiting 09/02/2021   Avoidant-restrictive  food intake disorder (ARFID) 10/13/2019   Mild episode of recurrent major depressive disorder 10/13/2019   Mood disorder 03/06/2017   Intrinsic eczema 10/11/2015   Seasonal allergic rhinitis due to pollen 08/05/2015    REFERRING DIAG: M25.569 (ICD-10-CM) - Anterior knee pain   THERAPY DIAG:  Chronic pain of right knee  Muscle weakness (generalized)  Other abnormalities of gait and mobility  Rationale for Evaluation and Treatment Rehabilitation  PERTINENT HISTORY: None   PRECAUTIONS: None   SUBJECTIVE:  SUBJECTIVE STATEMENT:  Pt presents to PT with no current reports of pain. Feels like she has been doing really well and exercises have been good.    PAIN:  Are you having pain?  Yes: NPRS scale: 2/10 Worst: 9/10  Pain location: bilateral knee R>L Pain description: sharp  Aggravating factors: prolonged standing, driving, stairs Relieving factors: medication,    OBJECTIVE: (objective measures completed at initial evaluation unless otherwise dated)  DIAGNOSTIC FINDINGS:             N/A   PATIENT SURVEYS:  FOTO: 61% function; 59% predicted - 06/26/2022   COGNITION: Overall cognitive status: Within functional limits for tasks assessed                         SENSATION: WFL   POSTURE: rounded shoulders, forward head, and genu valgus   PALPATION: TTP to medial R knee joint line, painful patellar glides R    LOWER EXTREMITY ROM:   Active ROM Right eval Left eval  Hip flexion      Hip extension      Hip abduction      Hip adduction      Hip internal rotation      Hip external rotation      Knee flexion WNL WNL  Knee extension WNL WNL  Ankle dorsiflexion      Ankle plantarflexion      Ankle inversion      Ankle eversion       (Blank rows = not tested)   LOWER EXTREMITY MMT:    MMT Right eval Left eval  Hip flexion      Hip extension      Hip abduction 3+/5 3+/5  Hip adduction      Hip internal rotation      Hip external rotation      Knee flexion 4/5 4/5  Knee extension 3+/5 4/5  Ankle dorsiflexion      Ankle plantarflexion      Ankle inversion      Ankle eversion       (Blank rows = not tested)   LOWER EXTREMITY SPECIAL TESTS:  Knee special tests: Lachman Test: negative   FUNCTIONAL TESTS:  30 Second Sit to Stand: 13 reps   GAIT: Distance walked: 39ft Assistive device utilized: None Level of assistance: Complete Independence Comments: antalgic gait R, bilateral out-toeing      TREATMENT: [facility] Adult PT Treatment:                                                DATE: 06/26/22 Therapeutic Exercise: Nustep L4 8 min Quad sets x 10 - 5" hold SLR x 15 each S/L hip abd x 10 each Bridge with GTB x 10 S/L clam x 10 GTB Tape applied to R knee with lateral glide  Therapeutic Activity: Assessment of tests/measures, goals, and outcomes for discharge   PATIENT EDUCATION:  Education details: final HEP Person educated: Patient Education method: Explanation, Demonstration, and Handouts Education comprehension: verbalized understanding and returned demonstration   HOME EXERCISE PROGRAM: Access Code: [email or web address] URL: [email or web address] Date: 06/26/2022 Prepared by: Edwinna Areola  Exercises - Supine Quadricep Sets  - 3-4 x weekly - 2 sets - 10 reps - 5 sec hold - Active Straight Leg Raise with Quad Set  - 3-4 x weekly -  2 sets - 15 reps - Sidelying Hip Abduction  - 3-4 x weekly - 2 sets - 10 reps - Supine Bridge with Resistance Band  - 3-4 x weekly - 3 sets - 10 reps - green band hold - Clamshell with Resistance  - 3-4 x weekly - 3 sets - 10 reps - green band hold - Seated Piriformis Stretch with Trunk Bend  - 3-4 x weekly - 2 sets - 30 sec hold - Side Stepping with Resistance at Ankles and Counter Support  - 3-4 x weekly - 3 reps -  red band hold - Standing Hip Abduction with Resistance at Ankles and Counter Support  - 3-4 x weekly - 2 sets - 10 reps - red back hold   ASSESSMENT:   CLINICAL IMPRESSION:  Pt was able to complete all prescribed exercises and demonstrated knowledge of HEP with no adverse effect. Over the course of PT treatment she has progressed very well, noting decrease in knee pain and improved subjective functional ability assessed via FOTO. She should continue to improve with HEP compliance and is ready to discharge from skilled PT service at this time.   OBJECTIVE IMPAIRMENTS: decreased activity tolerance, decreased mobility, difficulty walking, decreased strength, and pain.    ACTIVITY LIMITATIONS: lifting, standing, squatting, stairs, and transfers   PARTICIPATION LIMITATIONS: driving, shopping, community activity, occupation, and yard work   PERSONAL FACTORS: Time since onset of injury/illness/exacerbation are also affecting patient's functional outcome.      GOALS: Goals reviewed with patient? No   SHORT TERM GOALS: Target date: 06/17/2022   Pt will be compliant and knowledgeable with initial HEP for improved comfort and carryover Baseline: initial HEP given  Goal status: MET   2.  Pt will self report bilateral knee pain no greater than 6/10 for improved comfort and functional ability Baseline: 9/10 at worst; 06/19/22 Minimal pain cited today Goal status: MET     LONG TERM GOALS: Target date: 07/22/2022   Pt will improve FOTO function score to no less than 59% as proxy for functional improvement Baseline: 41% function 06/26/2022: 61% function Goal status: MET   2.  Pt will self report bilateral knee pain no greater than 3/10 for improved comfort and functional ability Baseline: 9/10 at worst Goal status: MET   3.  Pt will increase 30 Second Sit to Stand rep count to no less than 12 reps for improved balance, strength, and functional mobility Baseline: 10 reps  06/26/2022: 13 reps Goal  status: MET    4.  Pt will be able to ambulate up/down a flight of stairs with no increase in bilateral knee pain and reciprocal gait for improved comfort with home and community activities  Baseline: unable Goal status: MET     PLAN:   PT FREQUENCY: 2x/week   PT DURATION: 8 weeks   PLANNED INTERVENTIONS: Therapeutic exercises, Therapeutic activity, Neuromuscular re-education, Balance training, Gait training, Patient/Family education, Self Care, Joint mobilization, Aquatic Therapy, Dry Needling, Electrical stimulation, Vasopneumatic device, Manual therapy, and Re-evaluation   PLAN FOR NEXT SESSION: progress quad and proximal hip strength   Eloy Endavid C Mirra Basilio, PT 06/26/2022, 5:31 PM

## 2022-07-06 ENCOUNTER — Encounter (HOSPITAL_BASED_OUTPATIENT_CLINIC_OR_DEPARTMENT_OTHER): Payer: Self-pay | Admitting: Emergency Medicine

## 2022-07-06 ENCOUNTER — Emergency Department (HOSPITAL_BASED_OUTPATIENT_CLINIC_OR_DEPARTMENT_OTHER)
Admission: EM | Admit: 2022-07-06 | Discharge: 2022-07-06 | Disposition: A | Discharge status: Home / Self Care | Payer: No Typology Code available for payment source | Attending: Emergency Medicine | Admitting: Emergency Medicine

## 2022-07-06 ENCOUNTER — Other Ambulatory Visit: Payer: Self-pay

## 2022-07-06 DIAGNOSIS — H9202 Otalgia, left ear: Secondary | ICD-10-CM | POA: Diagnosis present

## 2022-07-06 DIAGNOSIS — H6692 Otitis media, unspecified, left ear: Secondary | ICD-10-CM | POA: Insufficient documentation

## 2022-07-06 DIAGNOSIS — Z1152 Encounter for screening for COVID-19: Secondary | ICD-10-CM | POA: Insufficient documentation

## 2022-07-06 LAB — SARS CORONAVIRUS 2 BY RT PCR: SARS Coronavirus 2 by RT PCR: NEGATIVE

## 2022-07-06 MED ORDER — AMOXICILLIN 500 MG PO CAPS: 500.0000 mg | ORAL_CAPSULE | Freq: Two times a day (BID) | ORAL | 0 refills | Status: AC

## 2022-07-06 NOTE — ED Triage Notes (Signed)
Pt via pov from home with left ear pain today. She reports that she has had nasal congestion and coughing since Tuesday and the ear pain is new today. Pt alert & oriented, nad noted.

## 2022-07-06 NOTE — ED Notes (Signed)
Patient verbalizes understanding of discharge instructions. Opportunity for questioning and answers were provided. Patient discharged from ED.  °

## 2022-07-06 NOTE — ED Provider Notes (Signed)
[place] EM[facility]GENC[facility] DEPARTMENT AT [facility] Provider Note   CSN: [id number] Arrival date & time: 07/06/22  1545     History  Chief Complaint  Patient presents with   Otalgia    Kristen Pacheco is a 22 y.o. female with history of depression, eating disorder, OCD, HLD who presents to the ER complaining of left ear pain. Has had nasal congestion for a few days, taking OTC meds. While at work today started having left ear pain and change in hearing. No fever. Denies any foreign body in the ear.    Otalgia      Home Medications Prior to Admission medications   Medication Sig Start Date End Date Taking? Authorizing Provider  amoxicillin (AMOXIL) 500 MG capsule Take 1 capsule (500 mg total) by mouth 2 (two) times daily for 7 days. 07/06/22 07/13/22 Yes Caylen Kuwahara T, PA-C  etonogestrel (NEXPLANON) 68 MG IMPL implant 1 each by Subdermal route once.    [provider]  FLUoxetine (PROZAC) 20 MG tablet Take 20 mg by mouth daily.    [provider]  loperamide (IMODIUM) 2 MG capsule Take 1 capsule (2 mg total) by mouth 4 (four) times daily as needed for diarrhea or loose stools. 01/20/22   Sabas Sous, MD  metoCLOPramide (REGLAN) 5 MG tablet Take 1 tablet (5 mg total) by mouth 4 (four) times daily -  before meals and at bedtime. 01/01/22 01/31/22  Rhetta Mura, MD  ondansetron (ZOFRAN-ODT) 4 MG disintegrating tablet Take 1 tablet (4 mg total) by mouth every 8 (eight) hours as needed for nausea or vomiting. 01/20/22   Sabas Sous, MD  pantoprazole (PROTONIX) 40 MG tablet Take 1 tablet (40 mg total) by mouth daily. 09/09/21 12/31/21  Almon Hercules, MD      Allergies    Pollen extract and Tape    Review of Systems   Review of Systems  HENT:  Positive for ear pain.   All other systems reviewed and are negative.   Physical Exam Updated Vital Signs BP 127/86 (BP Location: Right Arm)   Pulse (!) 102   Temp 97.9 F (36.6 C)   Resp 20   Ht   (1.575 m)   Wt 101.2 kg   LMP  (LMP Unknown)   SpO2 100%   BMI 40.79 kg/m  Physical Exam Vitals and nursing note reviewed.  Constitutional:      Appearance: Normal appearance.  HENT:     Head: Normocephalic and atraumatic.     Right Ear: Tympanic membrane, ear canal and external ear normal.     Left Ear: Ear canal and external ear normal. No mastoid tenderness. Tympanic membrane is erythematous and bulging.  Eyes:     Conjunctiva/sclera: Conjunctivae normal.  Pulmonary:     Effort: Pulmonary effort is normal. No respiratory distress.  Skin:    General: Skin is warm and dry.  Neurological:     Mental Status: She is alert.  Psychiatric:        Mood and Affect: Mood normal.        Behavior: Behavior normal.     ED Results / Procedures / Treatments   Labs (all labs ordered are listed, but only abnormal results are displayed) Labs Reviewed  SARS CORONAVIRUS 2 BY RT PCR    EKG None  Radiology No results found.  Procedures Procedures    Medications Ordered in ED Medications - No data to display  ED Course/ Medical Decision Making/ A&P  Medical Decision Making Risk Prescription drug management.   Patient is a 22 year old female with a history of depression, eating disorder, OCD, HLD presents emergency department complaining of left ear pain starting earlier today.  Has had sinus congestion for the past several days and taking OTC meds.  On exam patient mildly tachycardic in the low 100s, afebrile.  Clinically well-appearing.  Right ear exam normal, left TM erythematous and bulging.  No mastoid tenderness.  Exam consistent with acute otitis media of the left ear.  Covid test negative.  Will treat with antibiotics, and recommend OTC meds as needed for pain.  Not requiring admission today.  Discharged in stable condition, and all questions answered.  Final Clinical Impression(s) / ED Diagnoses Final diagnoses:  Acute left otitis  media  Left ear pain    Rx / DC Orders ED Discharge Orders          Ordered    amoxicillin (AMOXIL) 500 MG capsule  2 times daily        07/06/22 1658           Portions of this report may have been transcribed using voice recognition software. Every effort was made to ensure accuracy; however, inadvertent computerized transcription errors may be present.    Jeanella Flattery 07/06/22 1702    Maia Plan, MD 07/07/22 1126

## 2022-07-06 NOTE — Discharge Instructions (Signed)
You were seen in the ER for left ear pain.  As we discussed, your left ear looks infected. We will treat with antibiotics. You can take ibuprofen and/or tylenol for pain.  Continue to monitor how you're doing and return to the ER for new or worsening symptoms.

## 2022-08-15 ENCOUNTER — Emergency Department (HOSPITAL_BASED_OUTPATIENT_CLINIC_OR_DEPARTMENT_OTHER): Payer: No Typology Code available for payment source

## 2022-08-15 ENCOUNTER — Encounter (HOSPITAL_BASED_OUTPATIENT_CLINIC_OR_DEPARTMENT_OTHER): Payer: Self-pay | Admitting: Emergency Medicine

## 2022-08-15 ENCOUNTER — Emergency Department (HOSPITAL_BASED_OUTPATIENT_CLINIC_OR_DEPARTMENT_OTHER)
Admission: EM | Admit: 2022-08-15 | Discharge: 2022-08-15 | Disposition: A | Discharge status: Home / Self Care | Payer: No Typology Code available for payment source | Attending: Emergency Medicine | Admitting: Emergency Medicine

## 2022-08-15 ENCOUNTER — Other Ambulatory Visit: Payer: Self-pay

## 2022-08-15 DIAGNOSIS — R350 Frequency of micturition: Secondary | ICD-10-CM | POA: Insufficient documentation

## 2022-08-15 DIAGNOSIS — R1032 Left lower quadrant pain: Secondary | ICD-10-CM | POA: Diagnosis not present

## 2022-08-15 DIAGNOSIS — R109 Unspecified abdominal pain: Secondary | ICD-10-CM

## 2022-08-15 LAB — CBC
HCT: 32.7 % — ABNORMAL LOW (ref 36.0–46.0)
Hemoglobin: 10.3 g/dL — ABNORMAL LOW (ref 12.0–15.0)
MCH: 23.5 pg — ABNORMAL LOW (ref 26.0–34.0)
MCHC: 31.5 g/dL (ref 30.0–36.0)
MCV: 74.5 fL — ABNORMAL LOW (ref 80.0–100.0)
Platelets: 588 10*3/uL — ABNORMAL HIGH (ref 150–400)
RBC: 4.39 MIL/uL (ref 3.87–5.11)
RDW: 16.4 % — ABNORMAL HIGH (ref 11.5–15.5)
WBC: 9.3 10*3/uL (ref 4.0–10.5)
nRBC: 0 % (ref 0.0–0.2)

## 2022-08-15 LAB — URINALYSIS, ROUTINE W REFLEX MICROSCOPIC
Bilirubin Urine: NEGATIVE
Glucose, UA: NEGATIVE mg/dL
Hgb urine dipstick: NEGATIVE
Ketones, ur: NEGATIVE mg/dL
Leukocytes,Ua: NEGATIVE
Nitrite: NEGATIVE
Protein, ur: NEGATIVE mg/dL
Specific Gravity, Urine: 1.009 (ref 1.005–1.030)
pH: 6.5 (ref 5.0–8.0)

## 2022-08-15 LAB — COMPREHENSIVE METABOLIC PANEL
ALT: 12 U/L (ref 0–44)
AST: 15 U/L (ref 15–41)
Albumin: 3.7 g/dL (ref 3.5–5.0)
Alkaline Phosphatase: 94 U/L (ref 38–126)
Anion gap: 7 (ref 5–15)
BUN: 15 mg/dL (ref 6–20)
CO2: 25 mmol/L (ref 22–32)
Calcium: 8.9 mg/dL (ref 8.9–10.3)
Chloride: 105 mmol/L (ref 98–111)
Creatinine, Ser: 0.92 mg/dL (ref 0.44–1.00)
GFR, Estimated: >60 mL/min (ref >60)
Glucose, Bld: 69 mg/dL — ABNORMAL LOW (ref 70–99)
Potassium: 4.2 mmol/L (ref 3.5–5.1)
Sodium: 137 mmol/L (ref 135–145)
Total Bilirubin: 0.5 mg/dL (ref 0.3–1.2)
Total Protein: 7.4 g/dL (ref 6.5–8.1)

## 2022-08-15 LAB — PREGNANCY, URINE: Preg Test, Ur: NEGATIVE

## 2022-08-15 LAB — CBG MONITORING, ED: Glucose-Capillary: 73 mg/dL (ref 70–99)

## 2022-08-15 MED ORDER — KETOROLAC TROMETHAMINE 15 MG/ML IJ SOLN
15.0000 mg | Freq: Once | INTRAMUSCULAR | Status: AC
  Administered 2022-08-15: 15 mg via INTRAVENOUS
  Filled 2022-08-15: qty 1

## 2022-08-15 MED ORDER — CEPHALEXIN 500 MG PO CAPS: 500.0000 mg | ORAL_CAPSULE | Freq: Four times a day (QID) | ORAL | 0 refills | Status: AC

## 2022-08-15 MED ORDER — IOHEXOL 300 MG/ML  SOLN
100.0000 mL | Freq: Once | INTRAMUSCULAR | Status: AC | PRN
  Administered 2022-08-15: 85 mL via INTRAVENOUS

## 2022-08-15 NOTE — ED Notes (Signed)
Pt's CBG result was 73. Josh - Medic informed.

## 2022-08-15 NOTE — ED Provider Notes (Signed)
I provided a substantive portion of the care of this patient.  I personally made/approved the management plan for this patient and take responsibility for the patient management.    Ultrasound ED Peripheral IV (Provider)  Date/Time: 08/15/2022 12:26 PM  Performed by: Blane Ohara, MD Authorized by: Blane Ohara, MD   Procedure details:    Indications: multiple failed IV attempts     Skin Prep: chlorhexidine gluconate     Location:  Right AC   Angiocath:  20 G   Bedside Ultrasound Guided: Yes     Images: archived     Patient tolerated procedure without complications: Yes       Blane Ohara, MD 08/17/22 1116

## 2022-08-15 NOTE — ED Notes (Signed)
Discharge paperwork given and verbally understood. 

## 2022-08-15 NOTE — ED Provider Notes (Signed)
[place] EMERGENCY DEPARTMENT AT [facility] Provider Note   CSN: [id number] Arrival date & time: 08/15/22  4098     History  Chief Complaint  Patient presents with   Urinary Frequency    Kristen Pacheco is a 22 y.o. female.   Urinary Frequency   22 year old female presents emergency department with complaints of left-sided flank pain, urinary frequency, suprapubic pain.  Patient states that symptoms again this past Sunday with left-sided flank pain as well as urinary frequency.  Patient thought that she was peeing more frequently because she was drinking more water but as symptoms persisted with progression of pain, prompted visit to the emergency department.  Denies fever, chills, chest pain, shortness of breath, nausea, vomiting, dysuria, hematuria, vaginal discharge/bleeding, change in bowel habits.  Patient denies history of similar symptoms in the past.  Reports history of abdominal surgeries including cholecystectomy.  Last menstrual period 2 weeks ago.  Past medical history significant for depression, hyperlipidemia, OCD, obesity,  Home Medications Prior to Admission medications   Medication Sig Start Date End Date Taking? Authorizing Provider  cephALEXin (KEFLEX) 500 MG capsule Take 1 capsule (500 mg total) by mouth 4 (four) times daily. 08/15/22  Yes Sherian Maroon A, PA  etonogestrel (NEXPLANON) 68 MG IMPL implant 1 each by Subdermal route once.    [provider]  FLUoxetine (PROZAC) 20 MG tablet Take 20 mg by mouth daily.    [provider]  loperamide (IMODIUM) 2 MG capsule Take 1 capsule (2 mg total) by mouth 4 (four) times daily as needed for diarrhea or loose stools. 01/20/22   Sabas Sous, MD  metoCLOPramide (REGLAN) 5 MG tablet Take 1 tablet (5 mg total) by mouth 4 (four) times daily -  before meals and at bedtime. 01/01/22 01/31/22  Rhetta Mura, MD  ondansetron (ZOFRAN-ODT) 4 MG disintegrating tablet Take 1 tablet (4 mg  total) by mouth every 8 (eight) hours as needed for nausea or vomiting. 01/20/22   Sabas Sous, MD  pantoprazole (PROTONIX) 40 MG tablet Take 1 tablet (40 mg total) by mouth daily. 09/09/21 12/31/21  Almon Hercules, MD      Allergies    Pollen extract and Tape    Review of Systems   Review of Systems  Genitourinary:  Positive for frequency.  All other systems reviewed and are negative.   Physical Exam Updated Vital Signs BP 124/83 (BP Location: Right Arm)   Pulse 93   Temp 98.5 F (36.9 C) (Oral)   Resp 16   Ht 5' 2.5" (1.588 m)   Wt 104.3 kg   LMP 07/24/2022 (Approximate)   SpO2 100%   BMI 41.40 kg/m  Physical Exam Vitals and nursing note reviewed.  Constitutional:      General: She is not in acute distress.    Appearance: She is well-developed.  HENT:     Head: Normocephalic and atraumatic.  Eyes:     Conjunctiva/sclera: Conjunctivae normal.  Cardiovascular:     Rate and Rhythm: Normal rate and regular rhythm.     Heart sounds: No murmur heard. Pulmonary:     Effort: Pulmonary effort is normal. No respiratory distress.     Breath sounds: Normal breath sounds.  Abdominal:     Palpations: Abdomen is soft.     Tenderness: There is abdominal tenderness in the suprapubic area. There is left CVA tenderness. There is no right CVA tenderness or guarding.  Musculoskeletal:        General: No  swelling.     Cervical back: Neck supple.     Right lower leg: No edema.     Left lower leg: No edema.  Skin:    General: Skin is warm and dry.     Capillary Refill: Capillary refill takes less than 2 seconds.  Neurological:     Mental Status: She is alert.  Psychiatric:        Mood and Affect: Mood normal.     ED Results / Procedures / Treatments   Labs (all labs ordered are listed, but only abnormal results are displayed) Labs Reviewed  URINALYSIS, ROUTINE W REFLEX MICROSCOPIC - Abnormal; Notable for the following components:      Result Value   Color, Urine COLORLESS  (*)    All other components within normal limits  COMPREHENSIVE METABOLIC PANEL - Abnormal; Notable for the following components:   Glucose, Bld 69 (*)    All other components within normal limits  CBC - Abnormal; Notable for the following components:   Hemoglobin 10.3 (*)    HCT 32.7 (*)    MCV 74.5 (*)    MCH 23.5 (*)    RDW 16.4 (*)    Platelets 588 (*)    All other components within normal limits  PREGNANCY, URINE  CBG MONITORING, ED    EKG None  Radiology CT ABDOMEN PELVIS W CONTRAST  Result Date: 08/15/2022 CLINICAL DATA:  Urinary frequency, left lower back and pelvic pain. EXAM: CT ABDOMEN AND PELVIS WITH CONTRAST TECHNIQUE: Multidetector CT imaging of the abdomen and pelvis was performed using the standard protocol following bolus administration of intravenous contrast. RADIATION DOSE REDUCTION: This exam was performed according to the departmental dose-optimization program which includes automated exposure control, adjustment of the mA and/or kV according to patient size and/or use of iterative reconstruction technique. CONTRAST:  85mL OMNIPAQUE IOHEXOL 300 MG/ML  SOLN COMPARISON:  CT abdomen/pelvis 09/03/2021 FINDINGS: Lower chest: The lung bases are clear. The imaged heart is unremarkable. Hepatobiliary: The liver is unremarkable. The gallbladder is surgically absent. There is no biliary ductal dilatation. Pancreas: Unremarkable. Spleen: Unremarkable. Adrenals/Urinary Tract: The adrenals are unremarkable. The kidneys are unremarkable, with no focal lesion, stone, hydronephrosis, or hydroureter. The bladder is unremarkable. Stomach/Bowel: The stomach is unremarkable. There is no evidence of bowel obstruction. There is no abnormal bowel wall thickening or inflammatory change. The appendix is normal. Vascular/Lymphatic: The abdominal aorta is normal in course and caliber. The major branch vessels are patent. The main portal and splenic veins are patent. There is no abdominopelvic  lymphadenopathy. Reproductive: The uterus and adnexa are unremarkable. Other: There is no ascites or free air. Musculoskeletal: There is no acute osseous abnormality or suspicious osseous lesion. IMPRESSION: No acute finding in the abdomen or pelvis. Electronically Signed   By: Lesia Hausen M.D.   On: 08/15/2022 12:42    Procedures Procedures    Medications Ordered in ED Medications  ketorolac (TORADOL) 15 MG/ML injection 15 mg (15 mg Intravenous Given 08/15/22 1112)  iohexol (OMNIPAQUE) 300 MG/ML solution 100 mL (85 mLs Intravenous Contrast Given 08/15/22 1210)    ED Course/ Medical Decision Making/ A&P Clinical Course as of 08/15/22 1822  Thu Aug 15, 2022  1003 Lmp 2 weeks ago Sunday  [CR]    Clinical Course User Index [CR] Peter Garter, PA                             Medical Decision  Making Amount and/or Complexity of Data Reviewed Labs: ordered. Radiology: ordered.  Risk Prescription drug management.   This patient presents to the ED for concern of abdominal pain, this involves an extensive number of treatment options, and is a complaint that carries with it a high risk of complications and morbidity.  The differential diagnosis includes gastritis, PUD, pancreatitis, CBD pathology, hepatitis, SBO/LBO, volvulus, diverticulitis, appendicitis, pyelonephritis, nephrolithiasis, cystitis, ectopic pregnancy, ovarian torsion, PID, tubo-ovarian abscess   Co morbidities that complicate the patient evaluation  See HPI   Additional history obtained:  Additional history obtained from EMR External records from outside source obtained and reviewed including hospital records   Lab Tests:  I Ordered, and personally interpreted labs.  The pertinent results include: UA without abnormality.  Urine pregnancy negative.  No leukocytosis.  Patient with evidence of anemia with a hemoglobin of 10.3 of which is slightly decreased in patient's baseline.  Patient with thrombocytosis of 588  elevated near patient's baseline.  No electrolyte abnormalities.  Initial glucose of 69 of which corrected with administration of oral food/liquids.  No transaminitis.  No renal dysfunction.   Imaging Studies ordered:  I ordered imaging studies including CT abdomen pelvis I independently visualized and interpreted imaging which showed no acute intra-abdominal abnormality I agree with the radiologist interpretation   Cardiac Monitoring: / EKG:  The patient was maintained on a cardiac monitor.  I personally viewed and interpreted the cardiac monitored which showed an underlying rhythm of: Sinus rhythm   Consultations Obtained:  N/a   Problem List / ED Course / Critical interventions / Medication management  Urinary frequency/flank pain I ordered medication including Toradol   Reevaluation of the patient after these medicines showed that the patient improved I have reviewed the patients home medicines and have made adjustments as needed   Social Determinants of Health:  Tobacco, illicit drug use   Test / Admission - Considered:  Urinary frequency/flank pain Vitals signs within normal range and stable throughout visit. Laboratory/imaging studies significant for: See above 22 year old female presents emergency department complaints of urinary frequency and development of left flank pain.  Patient's workup today overall reassuring.  No evidence of nephrolithiasis or pyelonephritis.  UA was without signs of infection close position for cystitis.  CT imaging was pursued which showed no acute intra-abdominal/intrapelvic abnormalities.  No overlying rash indicative of cause of patient's tenderness.  Query possible musculoskeletal origin of patient's pain.  Given patient's symptoms of urinary frequency, will place patient empirically on antibiotics in the form of Keflex and recommend ibuprofen/Tylenol in the outpatient setting for patient's pain.  Recommend close follow-up with primary  care for reassessment of symptoms.  Treatment plan discussed at length with patient and family and they acknowledge understanding were agreeable to said plan. Worrisome signs and symptoms were discussed with the patient, and the patient acknowledged understanding to return to the ED if noticed. Patient was stable upon discharge.          Final Clinical Impression(s) / ED Diagnoses Final diagnoses:  Urinary frequency  Left flank pain    Rx / DC Orders ED Discharge Orders          Ordered    cephALEXin (KEFLEX) 500 MG capsule  4 times daily        08/15/22 1313              Peter Garter, [place] 08/15/22 Rickey Primus    Blane Ohara, MD 08/17/22 [phone]

## 2022-08-15 NOTE — Discharge Instructions (Addendum)
As discussed, workup today overall reassuring.  No evidence of kidney stone or inflammatory changes/infectious changes to your left kidney.  Your urine also did not look grossly abnormal but given your symptoms of urinary frequency, we will treat with antibiotics.  Recommend follow-up with primary care for reevaluation of your symptoms.  Please not hesitate to return to emergency department for worrisome signs and symptoms we discussed become apparent.

## 2022-08-15 NOTE — ED Triage Notes (Signed)
Pt arrived POV, caox4, ambulatory, NAD. Pt c/o increased urinary frequency, L lower back and pelvic pain since Sunday. Denies N/V/D. Denies fever/chills at home. Afebrile at present. Denies hx kidney stones.

## 2022-12-09 ENCOUNTER — Encounter (HOSPITAL_BASED_OUTPATIENT_CLINIC_OR_DEPARTMENT_OTHER): Payer: Self-pay

## 2022-12-09 ENCOUNTER — Emergency Department (HOSPITAL_BASED_OUTPATIENT_CLINIC_OR_DEPARTMENT_OTHER)
Admission: EM | Admit: 2022-12-09 | Discharge: 2022-12-10 | Disposition: A | Discharge status: Home / Self Care | Payer: MEDICAID | Attending: Emergency Medicine | Admitting: Emergency Medicine

## 2022-12-09 DIAGNOSIS — R1084 Generalized abdominal pain: Secondary | ICD-10-CM | POA: Insufficient documentation

## 2022-12-09 DIAGNOSIS — R112 Nausea with vomiting, unspecified: Secondary | ICD-10-CM | POA: Insufficient documentation

## 2022-12-09 DIAGNOSIS — E860 Dehydration: Secondary | ICD-10-CM | POA: Insufficient documentation

## 2022-12-09 LAB — URINALYSIS, ROUTINE W REFLEX MICROSCOPIC
Glucose, UA: NEGATIVE mg/dL
Ketones, ur: >80 mg/dL — AB
Nitrite: NEGATIVE
Protein, ur: 30 mg/dL — AB
Specific Gravity, Urine: 1.022 (ref 1.005–1.030)
pH: 6 (ref 5.0–8.0)

## 2022-12-09 LAB — COMPREHENSIVE METABOLIC PANEL
ALT: 15 U/L (ref 0–44)
AST: 18 U/L (ref 15–41)
Albumin: 4.1 g/dL (ref 3.5–5.0)
Alkaline Phosphatase: 74 U/L (ref 38–126)
Anion gap: 20 — ABNORMAL HIGH (ref 5–15)
BUN: <5 mg/dL — ABNORMAL LOW (ref 6–20)
CO2: 15 mmol/L — ABNORMAL LOW (ref 22–32)
Calcium: 9.5 mg/dL (ref 8.9–10.3)
Chloride: 103 mmol/L (ref 98–111)
Creatinine, Ser: 0.82 mg/dL (ref 0.44–1.00)
GFR, Estimated: >60 mL/min (ref >60)
Glucose, Bld: 80 mg/dL (ref 70–99)
Potassium: 3.4 mmol/L — ABNORMAL LOW (ref 3.5–5.1)
Sodium: 138 mmol/L (ref 135–145)
Total Bilirubin: 0.8 mg/dL (ref 0.3–1.2)
Total Protein: 8.1 g/dL (ref 6.5–8.1)

## 2022-12-09 LAB — CBC WITH DIFFERENTIAL/PLATELET
Abs Immature Granulocytes: 0.04 10*3/uL (ref 0.00–0.07)
Basophils Absolute: 0.1 10*3/uL (ref 0.0–0.1)
Basophils Relative: 1 %
Eosinophils Absolute: 0.1 10*3/uL (ref 0.0–0.5)
Eosinophils Relative: 1 %
HCT: 41.6 % (ref 36.0–46.0)
Hemoglobin: 13.7 g/dL (ref 12.0–15.0)
Immature Granulocytes: 0 %
Lymphocytes Relative: 24 %
Lymphs Abs: 2.6 10*3/uL (ref 0.7–4.0)
MCH: 23.9 pg — ABNORMAL LOW (ref 26.0–34.0)
MCHC: 32.9 g/dL (ref 30.0–36.0)
MCV: 72.6 fL — ABNORMAL LOW (ref 80.0–100.0)
Monocytes Absolute: 1 10*3/uL (ref 0.1–1.0)
Monocytes Relative: 9 %
Neutro Abs: 6.7 10*3/uL (ref 1.7–7.7)
Neutrophils Relative %: 65 %
Platelets: 678 10*3/uL — ABNORMAL HIGH (ref 150–400)
RBC: 5.73 MIL/uL — ABNORMAL HIGH (ref 3.87–5.11)
RDW: 19.3 % — ABNORMAL HIGH (ref 11.5–15.5)
WBC: 10.5 10*3/uL (ref 4.0–10.5)
nRBC: 0 % (ref 0.0–0.2)

## 2022-12-09 LAB — PREGNANCY, URINE: Preg Test, Ur: NEGATIVE

## 2022-12-09 LAB — CBG MONITORING, ED: Glucose-Capillary: 73 mg/dL (ref 70–99)

## 2022-12-09 NOTE — ED Triage Notes (Addendum)
Pt states she has an eating disorder (ARFID) And has had nausea x 2 weeks and has really has not eaten.  Has taken zofran every 6 hrs and is not helping Has lost 24lbs since June

## 2022-12-10 LAB — BASIC METABOLIC PANEL
Anion gap: 9 (ref 5–15)
BUN: <5 mg/dL — ABNORMAL LOW (ref 6–20)
CO2: 26 mmol/L (ref 22–32)
Calcium: 9.3 mg/dL (ref 8.9–10.3)
Chloride: 102 mmol/L (ref 98–111)
Creatinine, Ser: 0.91 mg/dL (ref 0.44–1.00)
GFR, Estimated: >60 mL/min (ref >60)
Glucose, Bld: 171 mg/dL — ABNORMAL HIGH (ref 70–99)
Potassium: 3.2 mmol/L — ABNORMAL LOW (ref 3.5–5.1)
Sodium: 137 mmol/L (ref 135–145)

## 2022-12-10 MED ORDER — PROMETHAZINE HCL 25 MG PO TABS: 25.0000 mg | ORAL_TABLET | Freq: Four times a day (QID) | ORAL | 0 refills | Status: AC | PRN

## 2022-12-10 MED ORDER — DEXTROSE 10 % IV SOLN: INTRAVENOUS | Status: DC

## 2022-12-10 MED ORDER — LACTATED RINGERS IV BOLUS
1000.0000 mL | Freq: Once | INTRAVENOUS | Status: AC
  Administered 2022-12-10: 1000 mL via INTRAVENOUS

## 2022-12-10 MED ORDER — PROMETHAZINE HCL 25 MG RE SUPP: 25.0000 mg | Freq: Four times a day (QID) | RECTAL | 0 refills | Status: AC | PRN

## 2022-12-10 MED ORDER — DIPHENHYDRAMINE HCL 50 MG/ML IJ SOLN
25.0000 mg | Freq: Once | INTRAMUSCULAR | Status: AC
  Administered 2022-12-10: 25 mg via INTRAVENOUS
  Filled 2022-12-10: qty 1

## 2022-12-10 MED ORDER — PROCHLORPERAZINE EDISYLATE 10 MG/2ML IJ SOLN
10.0000 mg | Freq: Once | INTRAMUSCULAR | Status: AC
  Administered 2022-12-10: 10 mg via INTRAVENOUS
  Filled 2022-12-10: qty 2

## 2022-12-10 MED ORDER — SODIUM CHLORIDE 0.9 % IV SOLN
12.5000 mg | Freq: Once | INTRAVENOUS | Status: AC
  Administered 2022-12-10: 12.5 mg via INTRAVENOUS
  Filled 2022-12-10: qty 0.5

## 2022-12-10 MED ORDER — PROMETHAZINE HCL 25 MG/ML IJ SOLN
INTRAMUSCULAR | Status: AC
  Filled 2022-12-10: qty 1

## 2022-12-10 NOTE — ED Provider Notes (Signed)
[name] Provider Note   CSN: [id number] Arrival date & time: 12/09/22  1858     History  Chief Complaint  Patient presents with   Nausea    Kristen Pacheco is a 22 y.o. female.  22 year old female presents ER today with emesis.  She has some type of eating disorder where she is anxious about possible nausea so does not eat which makes her more nauseous and is soundly some type of positive feedback cycle.  Has been going on with that last couple weeks.  Zofran not helping.  Did have some type of anxiety provoking event recently that may have triggered this.  No fevers.  No diarrhea.  She has some mild diffuse abdominal cramping related to the emesis.  No other associated symptoms.        Home Medications Prior to Admission medications   Medication Sig Start Date End Date Taking? Authorizing Provider  promethazine (PHENERGAN) 25 MG suppository Place 1 suppository (25 mg total) rectally every 6 (six) hours as needed for nausea or vomiting. 12/10/22  Yes Clariece Roesler, Barbara Cower, MD  promethazine (PHENERGAN) 25 MG tablet Take 1 tablet (25 mg total) by mouth every 6 (six) hours as needed for nausea or vomiting. 12/10/22  Yes Harrell Niehoff, Barbara Cower, MD  cephALEXin (KEFLEX) 500 MG capsule Take 1 capsule (500 mg total) by mouth 4 (four) times daily. 08/15/22   Peter Garter, PA  etonogestrel (NEXPLANON) 68 MG IMPL implant 1 each by Subdermal route once.    [provider]  FLUoxetine (PROZAC) 20 MG tablet Take 20 mg by mouth daily.    [provider]  loperamide (IMODIUM) 2 MG capsule Take 1 capsule (2 mg total) by mouth 4 (four) times daily as needed for diarrhea or loose stools. 01/20/22   Sabas Sous, MD  metoCLOPramide (REGLAN) 5 MG tablet Take 1 tablet (5 mg total) by mouth 4 (four) times daily -  before meals and at bedtime. 01/01/22 01/31/22  Rhetta Mura, MD  ondansetron (ZOFRAN-ODT) 4 MG disintegrating tablet Take 1 tablet  (4 mg total) by mouth every 8 (eight) hours as needed for nausea or vomiting. 01/20/22   Sabas Sous, MD  pantoprazole (PROTONIX) 40 MG tablet Take 1 tablet (40 mg total) by mouth daily. 09/09/21 12/31/21  Almon Hercules, MD      Allergies    Pollen extract and Tape    Review of Systems   Review of Systems  Physical Exam Updated Vital Signs BP 108/61 (BP Location: Right Arm)   Pulse 88   Temp 98.4 F (36.9 C) (Oral)   Resp 16   Ht 5\' 2"  (1.575 m)   Wt 95.2 kg   SpO2 100%   BMI 38.39 kg/m  Physical Exam Vitals and nursing note reviewed.  Constitutional:      Appearance: She is well-developed.  HENT:     Head: Normocephalic and atraumatic.     Mouth/Throat:     Pharynx: No oropharyngeal exudate or posterior oropharyngeal erythema.  Cardiovascular:     Rate and Rhythm: Normal rate and regular rhythm.  Pulmonary:     Effort: No respiratory distress.     Breath sounds: No stridor.  Abdominal:     General: There is no distension.  Musculoskeletal:        General: No swelling or tenderness. Normal range of motion.     Cervical back: Normal range of motion.  Skin:    General: Skin is  warm and dry.  Neurological:     General: No focal deficit present.     Mental Status: She is alert.     ED Results / Procedures / Treatments   Labs (all labs ordered are listed, but only abnormal results are displayed) Labs Reviewed  URINALYSIS, ROUTINE W REFLEX MICROSCOPIC - Abnormal; Notable for the following components:      Result Value   APPearance HAZY (*)    Hgb urine dipstick SMALL (*)    Bilirubin Urine SMALL (*)    Ketones, ur >80 (*)    Protein, ur 30 (*)    Leukocytes,Ua MODERATE (*)    Bacteria, UA RARE (*)    All other components within normal limits  CBC WITH DIFFERENTIAL/PLATELET - Abnormal; Notable for the following components:   RBC 5.73 (*)    MCV 72.6 (*)    MCH 23.9 (*)    RDW 19.3 (*)    Platelets 678 (*)    All other components within normal limits   COMPREHENSIVE METABOLIC PANEL - Abnormal; Notable for the following components:   Potassium 3.4 (*)    CO2 15 (*)    BUN <5 (*)    Anion gap 20 (*)    All other components within normal limits  BASIC METABOLIC PANEL - Abnormal; Notable for the following components:   Potassium 3.2 (*)    Glucose, Bld 171 (*)    BUN <5 (*)    All other components within normal limits  PREGNANCY, URINE  CBG MONITORING, ED    EKG None  Radiology No results found.  Procedures Procedures    Medications Ordered in ED Medications  promethazine (PHENERGAN) 25 MG/ML injection (  Not Given 12/10/22 0557)  prochlorperazine (COMPAZINE) injection 10 mg (10 mg Intravenous Given 12/10/22 0024)  diphenhydrAMINE (BENADRYL) injection 25 mg (25 mg Intravenous Given 12/10/22 0051)  lactated ringers bolus 1,000 mL (0 mLs Intravenous Stopped 12/10/22 0619)  promethazine (PHENERGAN) 12.5 mg in sodium chloride 0.9 % 50 mL IVPB (0 mg Intravenous Stopped 12/10/22 0513)    ED Course/ Medical Decision Making/ A&P                                 Medical Decision Making Amount and/or Complexity of Data Reviewed Labs: ordered.  Risk Prescription drug management.   Labs consistent with starvation ketosis.  Will start D10 infusion and give some meds to see if she can eat and drink.  Will recheck labs.  Mostly symptom control is much as possible however I think it is unlikely that we are going to fix her problems at this time as it seems like it is more chronic in nature related to the eating disorder and needs more outpatient therapy/counseling and chronic treatment rather than acute. No indication for imaging at this time.  Patient's nausea improved.  We checked a BMP to ensure that her bicarbonate improved and it has significantly.  Her potassium is a bit lower but suspect this is more of a delusional thing rather than true hypokalemia this will equilibrate when she is starts eaten and urinate again.  Once again I do  not think I can do much to help with her long-term eating disorder.  I did discuss with her and her mother possibly using Ativan in very rare extreme instances but together we decided probably was not a good idea since it was so addictive and very well could  lead to dependency and this kind of situation with these kind of chronic issues. No emesis here, has been sipping fluids. No indication for imaign, further workup or management here. Will fu w/ outpatient tem for further management.    Final Clinical Impression(s) / ED Diagnoses Final diagnoses:  Nausea and vomiting, unspecified vomiting type  Dehydration    Rx / DC Orders ED Discharge Orders          Ordered    promethazine (PHENERGAN) 25 MG tablet  Every 6 hours PRN        12/10/22 0550    promethazine (PHENERGAN) 25 MG suppository  Every 6 hours PRN        12/10/22 0550              Gwyn Mehring, Barbara Cower, MD 12/10/22 [phone]

## 2023-03-11 NOTE — Progress Notes (Signed)
 ------------------------------------------------------------------------------- Attestation signed by Shanna JINNY Denmark, MD at 03/13/2023  8:51 AM Needs a GES if not done, otherwise I agree -------------------------------------------------------------------------------     GASTROENTEROLOGY OUTPATIENT OFFICE NOTE  PCP PHYSICIAN: Bernardino JAYSON Level, FNP  CC: F/u nausea and irregular bowel habits  HISTORY OF PRESENT ILLNESS: Kristen Pacheco is a 22 y.o. 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 Her chart reviewed and followed by Shanna DOROTHA Denmark, MD ; last seen in office on 01/27/23 for the same - following OV pt was to trial bile-acid sequestrant along with PRN Fdguard and f/u in 4-6 weeks (today's appointment)  Per chart review - pt underwent inpt GI consultation at [facility] in 6/23 for further evaluation of N/V. She underwent inpt GI consultation by provider from [facility]. For details, please refer to their notes. Pt underwent inpt EGD (results below). After procedure symptoms were not felt to be GI-related.  Pt was seen at [facility] in 10/24 secondary to nausea and diarrhea. For details, please refer to their note. Pt reported ondansetron  no longer working, so requested promethazine .   Today, pt notes that she has still been unable to attain citrate free cholestyramine (as she states not being able to tolerate cholestyramine in the past), per pt she tried cholestyramine for about 3 days following CCY and was unable to tolerate the medication due to GI upset (epigastric/upper abdominal cramp like pain and pyrosis). Pt recently tried trial of colestipol 2 g BID of which she was unable to swallow the pill (has history of eating disorder). She denies having any acute upper or lower GI complaints today, stating that her symptoms are the  same as at last visit 01/27/23 - however, pt states that for several weeks following OV she felt improvement in her GI symptoms despite no bile acid sequestrant use, but that once she started her menstrual cycle (yesterday) her symptoms of nausea and chronic loose stools returned and have been present since (currently on Slynd - oral estrogen free pill - taking since removal of Nexplanon on 01/21/23). Pt notes that her menstrual cycle is very irregular and heavy I was suppose to start my cycle on the 12 th of this month. Today patient also endorses lower abdominal pain (cramp-like in nature that is noted to improve following BM). She notes constant nausea made worse with PO intake - no vomiting. No heartburn or solid food dysphagia - currently on pantoprazole  40 mg QAM and famotidine 40 mg at night. Weight and appetite decreased per pt - with pt stating she really does not each much during the day - with her diet consisting mainly of Hawaiian sweet roles and the occasional ham sandwich - pt also notes that she drinks maybe two 16 oz bottles of water a day. No AsA/NSAID use. Notes having small volume diarrhea - with occasional hard stool once every 2 weeks. Pt also notes that she can go several days without a BM. Denies rectal bleeding, melena, steatorhea, fever, chills, nocturnal stools, or tenesmus.     CT ab/pel w/ cx (5/24) = no acute findings (s/p cholecystectomy)   EGD (6/23) N/V = normal UGI tract   ALLERGIES: Allergies  Allergen Reactions  . Adhesive Other (See Comments)    Coban wrap- Skin broke out once  . Bee Pollen Other (See Comments)    Runny nose,  itchy eyes, stuffy nose  . Surgical Tape Other (See Comments)    Coban wrap- Skin broke out once    MEDICATIONS:  Current Outpatient Medications:  .  ALPRAZolam (XANAX) 0.25 mg tablet, Take 0.125 mg by mouth daily as needed for anxiety., Disp: 10 tablet, Rfl: 0 .  ASHWAGANDHA EXTRACT ORAL, Take by mouth., Disp: , Rfl:  .  cetirizine  (ZyrTEC) 10 mg tablet, Take 1 tablet (10 mg total) by mouth daily., Disp: 90 tablet, Rfl: 3 .  colestipol (COLESTID) 1 gram tablet, TAKE 2 TABLETS BY MOUTH 2 TIMES A DAY., Disp: 360 tablet, Rfl: 1 .  doxylamine-pyridoxine  (DICLEGIS) 10-10 mg TbEC per DR tablet, Take 2 tablets by mouth daily., Disp: 30 tablet, Rfl: 0 .  drospirenone, contraceptive, (Slynd) 4 mg (28) tab, Take 1 tablet by mouth daily., Disp: 84 tablet, Rfl: 3 .  famotidine (PEPCID) 40 mg tablet, TAKE 1 TABLET BY MOUTH EVERY DAY AT NIGHT, Disp: 90 tablet, Rfl: 0 .  FLUoxetine (PROzac) 4 mg/mL oral solution, TAKE 5 ML (20 MG TOTAL) BY MOUTH DAILY, Disp: 120 mL, Rfl: 0 .  ibuprofen  (MOTRIN ) 800 mg tablet, Take 800 mg by mouth., Disp: , Rfl:  .  loperamide  (IMODIUM ) 2 mg capsule, Take 2 mg by mouth., Disp: , Rfl:  .  metoclopramide  (REGLAN ) 5 mg tablet, Take 1 tablet (5 mg total) by mouth 3 (three) times a day., Disp: 270 tablet, Rfl: 1 .  nystatin (MYCOSTATIN) 100,000 unit/gram cream, , Disp: , Rfl:  .  ondansetron  (ZOFRAN -ODT) 4 mg disintegrating tablet, Take 4 mg by mouth every 8 (eight) hours as needed for nausea., Disp: 90 tablet, Rfl: 5 .  pantoprazole  (PROTONIX ) 40 mg EC tablet, TAKE 1 TABLET BY MOUTH EVERY DAY, Disp: 90 tablet, Rfl: 1 .  promethazine  (PHENERGAN ) 25 mg suppository, Insert 25 mg into the rectum., Disp: , Rfl:  .  promethazine  (PHENERGAN ) 25 mg tablet, Take 1 tablet (25 mg total) by mouth every 6 (six) hours as needed for vomiting or nausea., Disp: 30 tablet, Rfl: 0 .  albuterol HFA (PROVENTIL HFA;VENTOLIN HFA;PROAIR HFA) 90 mcg/actuation inhaler, Inhale. (Patient not taking: Reported on 01/27/2023), Disp: , Rfl:   PAST MEDICAL HISTORY: Patient Active Problem List  Diagnosis  . Fatigue  . GAD (generalized anxiety disorder)  . Seasonal allergic rhinitis due to pollen  . Intrinsic eczema  . Adjustment disorder with mixed anxiety and depressed mood  . Vitamin D deficiency  . IDA (iron deficiency anemia)  .  Avoidant-restrictive food intake disorder (ARFID)    PAST SURGICAL HISTORY: Past Surgical History:  Procedure Laterality Date  . CHOLECYSTECTOMY     Procedure: CHOLECYSTECTOMY  . OTHER SURGICAL HISTORY     Procedure: OTHER SURGICAL HISTORY (lymph node removal); neck  . WISDOM TOOTH EXTRACTION Bilateral    Procedure: WISDOM TOOTH EXTRACTION    SOCIAL HISTORY: Social History   Tobacco Use  Smoking Status Never  . Passive exposure: Never  Smokeless Tobacco Never   Social History   Substance and Sexual Activity  Alcohol Use Never   Social History   Substance and Sexual Activity  Drug Use Never    FAMILY HISTORY: Negative for colon cancer, colon polyps.  LABS: Pertinent labs per HPI  IMAGING:  Pertinent GI imaging per HPI  VITAL SIGNS:  Height: 1.575 m (5' 2.01) (03/11/2023  3:56 PM) Weight: 87.5 kg (193 lb) (03/11/2023  3:56 PM)  Body mass index is 35.29 kg/m.  Vitals:   03/11/23 1556  BP:  120/80  Pulse: (!) 112  Resp: 14  SpO2: 99%    PHYSICAL EXAM: Well developed, well nourished.  No acute distress.   Skin warm and dry.  HEENT: Normocephalic, atraumatic. No scleral icterus.  No oropharyngeal lesions, mucosa pink and moist. Neck supple. Lungs:Respiratory effort unlabored. Clear to auscultation.  Cardiac: Rhythm regular with no murmur. Abdomen:  hypoactive bowel sounds,soft, nondistended, tender lower abdomen to deep palpation  Extremities without  clubbing, cyanosis, edema.   ASSESSMENT 1. Irregular bowel habits   2. S/P cholecystectomy   3. Nausea   4. Decreased stooling     Orders Placed This Encounter  Procedures  . XR Abdomen 1 View    PLAN 1.) KUB today - pending results pt will start miralax  rescue plan (info provided to and explained to pt) - followed by BID miralax  use - explained to pt that some of her symptoms could be due to underlying constipation.  2.) Recommended pt to f/u with her OBGYN as to further work up her symptoms,  particularly if no stool burden is noted on the above KUB. 3.) Encouraged pt to reach out to her prior nutritionist as well - as to work on appropriate meal selections and to work through her food aversions - as she noted benefit in the past.  4.) We will continue to look for cholestyramine citrate free - explained to pt that if unable to locate we can trial the cholestyramine with citrate, to see how she tolerates the medication now - since she is on regular acid suppression and particularly if possible underlying constipation is corrected. 5.) Continue pantoprazole  40 mg and famotidine 40 mg daily. 6.) GERD diet and lifestyle modifications reviewed with pt. Explained the importance of her increasing her daily water intake as well 7.) Further recs and f/u pending the above plan - will tentatively plan for f/u in 2-3 months.

## 2023-04-29 NOTE — Progress Notes (Signed)
 Kristen Pacheco is a 23 y.o.  with  reports that she has never smoked. She has never been exposed to tobacco smoke. She has never used smokeless tobacco. with  Active Ambulatory Problems    Diagnosis Date Noted  . Fatigue 07/17/2015  . GAD (generalized anxiety disorder) 07/17/2015  . Seasonal allergic rhinitis due to pollen 08/05/2015  . Intrinsic eczema 10/11/2015  . Adjustment disorder with mixed anxiety and depressed mood 10/16/2015  . Vitamin D deficiency 04/05/2022  . IDA (iron deficiency anemia) 04/05/2022  . Avoidant-restrictive food intake disorder (ARFID) 04/05/2022   Resolved Ambulatory Problems    Diagnosis Date Noted  . No Resolved Ambulatory Problems   Past Medical History:  Diagnosis Date  . Arthritis   . GERD (gastroesophageal reflux disease)   . High cholesterol   . Irregular menses   . Major depressive disorder   . OCD (obsessive compulsive disorder)    who presents today at Urgent Care for  Chief Complaint  Patient presents with  . Headache    Patient reports having a headache, ear pain, and chest heaviness since Saturday. She had not taken any medications.        History of Present Illness The patient is a 23 year old female who presents with headache, ear pain, and chest heaviness since Saturday. She has not taken any medications.  She reports experiencing symptoms since Saturday night or early Sunday morning. She describes a sensation of elevated temperature, although her recorded temperature was within the normal range at 98 degrees. She reports no drainage from her ears but does experience mild pressure, predominantly located behind her eyes. She expresses concern about potential influenza infection, despite the absence of typical influenza symptoms. She has not received the influenza vaccine.  She has been experiencing a persistent headache for the past 4 days.  She reports chest tightness but no productive cough. She has a history of mild bronchitis in  2023, for which she was prescribed an inhaler.  Her blood pressure was elevated in both arms during this visit, which is not typical for her.  ALLERGIES The patient has no known allergies.  IMMUNIZATIONS The patient has not received the influenza vaccine.      Patient has no co-morbidities  or medications that might increase the risk for developing a severe infection     reports that she has never smoked. She has never been exposed to tobacco smoke. She has never used smokeless tobacco.  Review of Systems  Review of systems is otherwise negative except as noted in the HPI and Assessment/MDM   Physical Exam  BP 141/86   Pulse (!) 114   Temp 97.5 F (36.4 C) (Tympanic)   Resp 16   Wt 87.5 kg (193 lb)   LMP 04/10/2023   SpO2 100%   BMI 35.29 kg/m   Constitutional:      General: Patient is not in acute distress.    Appearance: Normal appearance.  Neurological:     General: No focal deficit present.     Mental Status: alert and oriented to person, place, and time.  HENT:     Eyes:     Conjunctiva/sclera: Conjunctivae normal. Pharynx nromal    There is n associated anterior cervical lymphadenopathy    Pupils: Pupils are equal, round, and reactive to light.     TM's normal with no visible effusion  Cardiovascular:     Heart sounds: Normal heart sounds. No murmur heard.    No Lower extremity edema noted Pulmonary:  Effort: Pulmonary effort is normal. No respiratory distress.     Breath sounds: No wheezing, rhonchi or rales.  Abdominal:     General: Bowel sounds are normal. There is no distension.     Tenderness: There is no abdominal tenderness.  Musculoskeletal:        General: Normal range of motion.     Neck:  No rigidity.  Skin:    General: Skin is warm and dry.  Psychiatric:        Mood and Affect: Mood normal.   No orders to display     DIAGNOSIS/PLAN     1. Congestion of nasal sinus  POC Influenza A&B NAT (IDNOW)   dexAMETHasone  (DECADRON) injection 10 mg   loratadine-pseudoePHEDrine (CLARITIN-D 12-hour) 5-120 mg per 12 hr tablet   [facility] Referral to [facility])    2. Acute ear pain, bilateral  POC Influenza A&B NAT (IDNOW)   dexAMETHasone (DECADRON) injection 10 mg   loratadine-pseudoePHEDrine (CLARITIN-D 12-hour) 5-120 mg per 12 hr tablet   Ambulatory Referral to [facility])    3. Nonintractable headache, unspecified chronicity pattern, unspecified headache type  ketorolac  (TORADOL ) injection 15 mg   Ambulatory Referral to [facility])      Assessment & Plan 1. Head congestion. Her symptoms do not align with a typical influenza presentation, and the influenza test returned negative results. A prescription for Zyrtec-D or Claritin-D, to be taken twice daily for a duration of 10 to 14 days, has been provided. A referral to Dr. Jolee has been initiated. If her condition deteriorates or fails to improve within a span of 3 to 5 days, she is advised to return for a follow-up visit.  2. Headache. A ketorolac  injection will be administered today. She is advised to abstain from ibuprofen  products until tomorrow.  3. Chest tightness. Her pulse oximetry readings are within normal limits, but her heart rate and blood pressure are slightly elevated. This could be a physiological response to her current illness. A steroid injection will be administered today to help alleviate her symptoms.  4. Elevated blood pressure. Her blood pressure is slightly elevated, which could be a response to her current illness.    We discussed risks and side effects of medications, and also discussed red flags which would warrant immediate follow-up.   Urgent Care Disposition:  Home Care
